# Patient Record
Sex: Male | Born: 1945 | Race: White | Hispanic: No | Smoking: Former smoker
Health system: Southern US, Community
[De-identification: ages and names within clinical notes are randomized; demographics above are authoritative.]

## PROBLEM LIST (undated history)

## (undated) DIAGNOSIS — I10 Essential (primary) hypertension: Secondary | ICD-10-CM

## (undated) DIAGNOSIS — F419 Anxiety disorder, unspecified: Secondary | ICD-10-CM

## (undated) HISTORY — PX: COLON SURGERY: SHX602

---

## 2014-07-05 ENCOUNTER — Ambulatory Visit: Payer: Self-pay | Admitting: Family Medicine

## 2014-10-02 ENCOUNTER — Ambulatory Visit: Payer: Medicare Other

## 2014-10-02 ENCOUNTER — Ambulatory Visit
Admission: EM | Admit: 2014-10-02 | Discharge: 2014-10-02 | Disposition: A | Discharge status: Home / Self Care | Payer: Medicare Other | Attending: Physician Assistant | Admitting: Physician Assistant

## 2014-10-02 DIAGNOSIS — S6991XA Unspecified injury of right wrist, hand and finger(s), initial encounter: Secondary | ICD-10-CM | POA: Diagnosis present

## 2014-10-02 DIAGNOSIS — Z7982 Long term (current) use of aspirin: Secondary | ICD-10-CM | POA: Diagnosis not present

## 2014-10-02 DIAGNOSIS — X58XXXA Exposure to other specified factors, initial encounter: Secondary | ICD-10-CM | POA: Insufficient documentation

## 2014-10-02 DIAGNOSIS — Z79899 Other long term (current) drug therapy: Secondary | ICD-10-CM | POA: Diagnosis not present

## 2014-10-02 DIAGNOSIS — S0081XA Abrasion of other part of head, initial encounter: Secondary | ICD-10-CM | POA: Diagnosis not present

## 2014-10-02 DIAGNOSIS — T148 Other injury of unspecified body region: Secondary | ICD-10-CM

## 2014-10-02 DIAGNOSIS — S80211A Abrasion, right knee, initial encounter: Secondary | ICD-10-CM | POA: Insufficient documentation

## 2014-10-02 DIAGNOSIS — T148XXA Other injury of unspecified body region, initial encounter: Secondary | ICD-10-CM

## 2014-10-02 HISTORY — DX: Anxiety disorder, unspecified: F41.9

## 2014-10-02 MED ORDER — TETANUS-DIPHTH-ACELL PERTUSSIS 5-2.5-18.5 LF-MCG/0.5 IM SUSP
0.5000 mL | Freq: Once | INTRAMUSCULAR | Status: AC
  Administered 2014-10-02: 0.5 mL via INTRAMUSCULAR

## 2014-10-02 MED ORDER — MUPIROCIN CALCIUM 2 % EX CREA: 1.0000 "application " | TOPICAL_CREAM | Freq: Three times a day (TID) | CUTANEOUS | Status: DC

## 2014-10-02 MED ORDER — AMOXICILLIN-POT CLAVULANATE 500-125 MG PO TABS: 1.0000 | ORAL_TABLET | Freq: Three times a day (TID) | ORAL | Status: DC

## 2014-10-02 MED ORDER — KETOROLAC TROMETHAMINE 60 MG/2ML IM SOLN
60.0000 mg | Freq: Once | INTRAMUSCULAR | Status: AC
  Administered 2014-10-02: 60 mg via INTRAMUSCULAR

## 2014-10-02 NOTE — ED Notes (Signed)
Right knee, cheek and chin abrasions cleaned with NS and anitibiotic onitment applied as directed.

## 2014-10-02 NOTE — ED Notes (Signed)
Pt fell while taking the garbage out due to the trash can being too heavy. Pt fell on cement about 1 hour ago. Pt's last Tdap was 09/09/2006.

## 2014-10-02 NOTE — Discharge Instructions (Signed)
Please use topical ointment to your facial wounds and right knee wound a few times a day- clen heands before and after- keep areas gently moist with the ointment to aid healing The finger dressings need to be clean and dry until Wound Care follow up Request evaluation tomorrow- Wednesday at the latest . Let us know if you don't hear from them  Keep fingers clean and dry--minimal use and keep elevated comfortably as much as possible

## 2014-10-03 ENCOUNTER — Encounter: Payer: Medicare Other | Attending: Surgery | Admitting: Surgery

## 2014-10-03 ENCOUNTER — Telehealth: Payer: Self-pay

## 2014-10-03 DIAGNOSIS — S61411A Laceration without foreign body of right hand, initial encounter: Secondary | ICD-10-CM | POA: Insufficient documentation

## 2014-10-03 DIAGNOSIS — Z87891 Personal history of nicotine dependence: Secondary | ICD-10-CM | POA: Diagnosis not present

## 2014-10-03 DIAGNOSIS — W19XXXA Unspecified fall, initial encounter: Secondary | ICD-10-CM | POA: Diagnosis not present

## 2014-10-03 DIAGNOSIS — R600 Localized edema: Secondary | ICD-10-CM | POA: Diagnosis not present

## 2014-10-03 NOTE — Progress Notes (Addendum)
KEISHAWN, RAJEWSKI ([id number]) Visit Report for 10/03/2014 Chief Complaint Document Details Patient Name: Manuel Cummings, Manuel Cummings Date of Service: 10/03/2014 1:00 PM Medical Record Number: [id number] Patient Account Number: [id number] Date of Birth/Sex: 03/14/1946 (69 y.o. Male) Treating RN: Primary Care Physician: Other Clinician: Referring Physician: Treating Physician/Extender: Frann Rider in Treatment: 0 Information Obtained from: Patient Chief Complaint Patient presents to the wound care center for a consult due non healing wound. Patient fell yesterday evening at 6 PM and injured his right hand on the concrete driveway and has had a pretty deep laceration on his right hand dorsum. Electronic Signature(s) Signed: 10/03/2014 3:34:49 PM By: Christin Fudge MD, FACS Entered By: Christin Fudge on 10/03/2014 14:25:22 ATHARVA, MIRSKY ([id number]) -------------------------------------------------------------------------------- HPI Details Patient Name: Manuel Cummings Date of Service: 10/03/2014 1:00 PM Medical Record Number: [id number] Patient Account Number: [id number] Date of Birth/Sex: 09/01/1945 (69 y.o. Male) Treating RN: Primary Care Physician: Other Clinician: Referring Physician: Treating Physician/Extender: Frann Rider in Treatment: 0 History of Present Illness Location: right hand and second third and fourth finger Quality: Patient reports experiencing a sharp pain to affected area(s). Severity: Wound is moderately severe with some exposed structure in the wound bed of the right hand dorsum on the middle finger Duration: Patient states the wound has been present for less than 24 hours. Timing: Pain in wound is constant (hurts all the time) Context: The wound occurred when the patient fell and injured his right hand dorsum on a concrete floor. Modifying Factors: Consults to this date include: in the ED at [facility] and has had a x-ray and some sutures placed. Associated  Signs and Symptoms: Patient reports presence of swelling HPI Description: This is an 45 gentleman warfarin on a concrete floor in his yard yesterday evening at about 6:00 and injured his right hand where the dorsum of the second third and fourth finger were injured and has had a complex lacerated wound. An x-ray was done which showed no bony abnormality but there was soft tissue injury and possible foreign bodies in this. The patient has had some sutures placed on the dorsum of his right middle finger but no details are available from the center. Also put on Augmentin. Tetanus toxoid order was given. Electronic Signature(s) Signed: 10/03/2014 3:34:49 PM By: Christin Fudge MD, FACS Entered By: Christin Fudge on 10/03/2014 14:30:43 Manuel Cummings ([id number]) -------------------------------------------------------------------------------- Physical Exam Details Patient Name: Manuel Cummings Date of Service: 10/03/2014 1:00 PM Medical Record Number: [id number] Patient Account Number: [id number] Date of Birth/Sex: 04-Aug-1945 (69 y.o. Male) Treating RN: Primary Care Physician: Other Clinician: Referring Physician: Treating Physician/Extender: Frann Rider in Treatment: 0 Constitutional . Pulse regular. Respirations normal and unlabored. Afebrile. . Eyes Nonicteric. Reactive to light. Ears, Nose, Mouth, and Throat Lips, teeth, and gums WNL.Marland Kitchen Moist mucosa without lesions . Neck supple and nontender. No palpable supraclavicular or cervical adenopathy. Normal sized without goiter. Respiratory WNL. No retractions.. Cardiovascular Pedal Pulses WNL. No clubbing, cyanosis or edema. Musculoskeletal Adexa without tenderness or enlargement.. Digits and nails w/o clubbing, cyanosis, infection, petechiae, ischemia, or inflammatory conditions.. Integumentary (Hair, Skin) He has superficial abrasions of the second and fourth finger on his right hand on the dorsum. The right middle finger dorsum  has a deeply lacerated wound just proximal to his nailbed and this has a skin flap which has been sutured in place. There are ulcerations on either side of the skin flap and I suspect tendon damage of the extensor as he is unable to  extend his finger well.. No crepitus or fluctuance. No peri-wound warmth or erythema. No masses.Marland Kitchen Psychiatric Judgement and insight Intact.. No evidence of depression, anxiety, or agitation.. Electronic Signature(s) Signed: 10/03/2014 3:34:49 PM By: Christin Fudge MD, FACS Entered By: Christin Fudge on 10/03/2014 14:39:03 Manuel Cummings ([id number]) -------------------------------------------------------------------------------- Physician Orders Details Patient Name: Manuel Cummings Date of Service: 10/03/2014 1:00 PM Medical Record Number: [id number] Patient Account Number: [id number] Date of Birth/Sex: 03-25-1946 (69 y.o. Male) Treating RN: Junious Dresser Primary Care Physician: Other Clinician: Referring Physician: Treating Physician/Extender: Frann Rider in Treatment: 0 Verbal / Phone Orders: Yes Clinician: Junious Dresser Read Back and Verified: Yes Diagnosis Coding Wound Cleansing Wound #1 Right Hand - 2nd Digit o Clean wound with Normal Saline. Wound #2 Right Hand - 3rd Digit o Clean wound with Normal Saline. Wound #3 Right Hand - 4th Digit o Clean wound with Normal Saline. Anesthetic Wound #1 Right Hand - 2nd Digit o Topical Lidocaine 4% cream applied to wound bed prior to debridement Wound #2 Right Hand - 3rd Digit o Topical Lidocaine 4% cream applied to wound bed prior to debridement Wound #3 Right Hand - 4th Digit o Topical Lidocaine 4% cream applied to wound bed prior to debridement Primary Wound Dressing Wound #1 Right Hand - 2nd Digit o Contact layer - Mepitel Wound #2 Right Hand - 3rd Digit o Contact layer - Mepitel Wound #3 Right Hand - 4th Digit o Contact layer - Mepitel Secondary Dressing Wound #1 Right  Hand - 2nd Digit o Gauze and Kerlix/Conform Wound #2 Right Hand - 3rd Digit o Gauze and Kerlix/Conform RAMERE, DOWNS ([id number]) Wound #3 Right Hand - 4th Digit o Gauze and Kerlix/Conform Dressing Change Frequency Wound #1 Right Hand - 2nd Digit o Other: - Pending consult with Dr. Margaretmary Eddy- please call for further instructions if Dr. Tamala Julian does not assume care of fingers Wound #2 Right Hand - 3rd Digit o Other: - Pending consult with Dr. Margaretmary Eddy- please call for further instructions if Dr. Tamala Julian does not assume care of fingers Wound #3 Right Hand - 4th Digit o Other: - Pending consult with Dr. Margaretmary Eddy- please call for further instructions if Dr. Tamala Julian does not assume care of fingers Follow-up Appointments Wound #1 Right Hand - 2nd Digit o Return Appointment in 1 week. - Pending consult with Dr. Margaretmary Eddy- please call to cancel appt with Dr. Tamala Julian assumes care of fingers Wound #2 Right Hand - 3rd Digit o Return Appointment in 1 week. - Pending consult with Dr. Margaretmary Eddy- please call to cancel appt with Dr. Tamala Julian assumes care of fingers Wound #3 Right Hand - 4th Digit o Return Appointment in 1 week. - Pending consult with Dr. Margaretmary Eddy- please call to cancel appt with Dr. Tamala Julian assumes care of fingers [facility] - Hand surgeon- Dr. Margaretmary Eddy appt 10/04/2014 11:00 am oooo Electronic Signature(s) Signed: 10/03/2014 3:34:49 PM By: Christin Fudge MD, FACS Signed: 10/03/2014 4:43:53 PM By: Junious Dresser RN Entered By: Junious Dresser on 10/03/2014 14:42:30 Catanese, Alfonso Patten ([id number]) -------------------------------------------------------------------------------- Problem List Details Patient Name: Manuel Cummings Date of Service: 10/03/2014 1:00 PM Medical Record Number: [id number] Patient Account Number: [id number] Date of Birth/Sex: May 25, 1945 (69 y.o. Male) Treating RN: Primary Care Physician: Other Clinician: Referring  Physician: Treating Physician/Extender: Frann Rider in Treatment: 0 Active Problems ICD-10 Encounter Code Description Active Date Diagnosis S61.411A Laceration without foreign body of right hand, initial 10/03/2014 Yes encounter Inactive Problems Resolved Problems  Electronic Signature(s) Signed: 10/03/2014 3:34:49 PM By: Christin Fudge MD, FACS Entered By: Christin Fudge on 10/03/2014 14:24:28 Manuel Cummings ([id number]) -------------------------------------------------------------------------------- Progress Note Details Patient Name: Manuel Cummings Date of Service: 10/03/2014 1:00 PM Medical Record Number: [id number] Patient Account Number: [id number] Date of Birth/Sex: [birth year] (69 y.o. Male) Treating RN: Primary Care Physician: Other Clinician: Referring Physician: Treating Physician/Extender: Frann Rider in Treatment: 0 Subjective Chief Complaint Information obtained from Patient Patient presents to the wound care center for a consult due non healing wound. Patient fell yesterday evening at 6 PM and injured his right hand on the concrete driveway and has had a pretty deep laceration on his right hand dorsum. History of Present Illness (HPI) The following HPI elements were documented for the patient's wound: Location: right hand and second third and fourth finger Quality: Patient reports experiencing a sharp pain to affected area(s). Severity: Wound is moderately severe with some exposed structure in the wound bed of the right hand dorsum on the middle finger Duration: Patient states the wound has been present for less than 24 hours. Timing: Pain in wound is constant (hurts all the time) Context: The wound occurred when the patient fell and injured his right hand dorsum on a concrete floor. Modifying Factors: Consults to this date include: in the ED at [facility] and has had a x-ray and some sutures placed. Associated Signs and Symptoms: Patient reports  presence of swelling This is an 63 gentleman warfarin on a concrete floor in his yard yesterday evening at about 6:00 and injured his right hand where the dorsum of the second third and fourth finger were injured and has had a complex lacerated wound. An x-ray was done which showed no bony abnormality but there was soft tissue injury and possible foreign bodies in this. The patient has had some sutures placed on the dorsum of his right middle finger but no details are available from the center. Also put on Augmentin. Tetanus toxoid order was given. Wound History Patient presents with 3 open wounds that have been present for approximately 24 hours. Patient has been treating wounds in the following manner: bandage from ED. Laboratory tests have not been performed in the last month. Patient reportedly has not tested positive for an antibiotic resistant organism. Patient reportedly has not tested positive for osteomyelitis. Patient reportedly has not had testing performed to evaluate circulation in the legs. Patient History Information obtained from Patient. SIE, FORMISANO ([id number]) Allergies penicillin, codeine Family History No family history of Cancer, Diabetes, Heart Disease, Hereditary Spherocytosis, Hypertension, Kidney Disease, Lung Disease, Seizures, Stroke, Thyroid Problems, Tuberculosis. Social History Former smoker, Marital Status - Married, Alcohol Use - Never, Drug Use - No History, Caffeine Use - Rarely. Medical History Eyes Denies history of Cataracts, Glaucoma, Optic Neuritis Ear/Nose/Mouth/Throat Denies history of Chronic sinus problems/congestion, Middle ear problems Hematologic/Lymphatic Denies history of Anemia, Hemophilia, Human Immunodeficiency Virus, Lymphedema, Sickle Cell Disease Respiratory Denies history of Aspiration, Asthma, Chronic Obstructive Pulmonary Disease (COPD), Pneumothorax, Sleep Apnea, Tuberculosis Cardiovascular Denies history of Angina,  Arrhythmia, Congestive Heart Failure, Coronary Artery Disease, Deep Vein Thrombosis, Hypertension, Hypotension, Myocardial Infarction, Peripheral Arterial Disease, Peripheral Venous Disease, Phlebitis, Vasculitis Gastrointestinal Denies history of Cirrhosis , Colitis, Crohn s, Hepatitis A, Hepatitis B, Hepatitis C Endocrine Denies history of Type I Diabetes, Type II Diabetes Genitourinary Denies history of End Stage Renal Disease Immunological Denies history of Lupus Erythematosus, Raynaud s, Scleroderma Integumentary (Skin) Denies history of History of Burn, History of pressure wounds Musculoskeletal Denies history  of Gout, Rheumatoid Arthritis, Osteoarthritis, Osteomyelitis Neurologic Denies history of Dementia, Neuropathy, Quadriplegia, Paraplegia, Seizure Disorder Oncologic Denies history of Received Chemotherapy, Received Radiation Psychiatric Denies history of Anorexia/bulimia, Confinement Anxiety Hospitalization/Surgery History - 10/02/2014, [facility] ED, post fall. Review of Systems (ROS) Constitutional Symptoms (General Health) The patient has no complaints or symptoms. Eyes DAEGON, DEISS ([id number]) The patient has no complaints or symptoms. Ear/Nose/Mouth/Throat The patient has no complaints or symptoms. Hematologic/Lymphatic The patient has no complaints or symptoms. Respiratory The patient has no complaints or symptoms. Cardiovascular The patient has no complaints or symptoms. Gastrointestinal The patient has no complaints or symptoms. Endocrine The patient has no complaints or symptoms. Genitourinary The patient has no complaints or symptoms. Immunological The patient has no complaints or symptoms. Integumentary (Skin) The patient has no complaints or symptoms. Musculoskeletal The patient has no complaints or symptoms. Neurologic The patient has no complaints or symptoms. Psychiatric Complains or has symptoms of Anxiety. Denies complaints or symptoms of  Claustrophobia. Medications aspirin 81 mg tablet,delayed release oral tablet,delayed release (DR/EC) oral lorazepam 0.5 mg tablet oral tablet oral amoxicillin 500 mg-potassium clavulanate 125 mg tablet oral tablet oral mupirocin 2 % topical cream topical cream topical amitriptyline 100 mg tablet oral tablet oral Objective Constitutional Pulse regular. Respirations normal and unlabored. Afebrile. Vitals Time Taken: 1:28 PM, Height: 74 in, Source: Stated, Weight: 195 lbs, Source: Stated, BMI: 25, Temperature: 97.9 F, Pulse: 67 bpm, Respiratory Rate: 18 breaths/min, Blood Pressure: 132/65 mmHg. MARTE, CELANI ([id number]) Eyes Nonicteric. Reactive to light. Ears, Nose, Mouth, and Throat Lips, teeth, and gums WNL.Marland Kitchen Moist mucosa without lesions . Neck supple and nontender. No palpable supraclavicular or cervical adenopathy. Normal sized without goiter. Respiratory WNL. No retractions.. Cardiovascular Pedal Pulses WNL. No clubbing, cyanosis or edema. Musculoskeletal Adexa without tenderness or enlargement.. Digits and nails w/o clubbing, cyanosis, infection, petechiae, ischemia, or inflammatory conditions.Marland Kitchen Psychiatric Judgement and insight Intact.. No evidence of depression, anxiety, or agitation.. Integumentary (Hair, Skin) He has superficial abrasions of the second and fourth finger on his right hand on the dorsum. The right middle finger dorsum has a deeply lacerated wound just proximal to his nailbed and this has a skin flap which has been sutured in place. There are ulcerations on either side of the skin flap and I suspect tendon damage of the extensor as he is unable to extend his finger well.. No crepitus or fluctuance. No peri-wound warmth or erythema. No masses.. Wound #1 status is Open. Original cause of wound was Trauma. The wound is located on the Right Hand - 2nd Digit. The wound measures 0.4cm length x 0.5cm width x 0.1cm depth; 0.157cm^2 area and 0.016cm^3 volume.  The wound is limited to skin breakdown. There is no tunneling or undermining noted. There is a large amount of sanguinous drainage noted. The wound margin is flat and intact. There is no granulation within the wound bed. There is no necrotic tissue within the wound bed. The periwound skin appearance exhibited: Localized Edema, Maceration. The periwound skin appearance did not exhibit: Callus, Crepitus, Excoriation, Fluctuance, Friable, Induration, Rash, Scarring, Dry/Scaly, Moist, Atrophie Blanche, Cyanosis, Ecchymosis, Hemosiderin Staining, Mottled, Pallor, Rubor, Erythema. Wound #2 status is Open. Original cause of wound was Trauma. The wound is located on the Right Hand - 3rd Digit. The wound measures 3.2cm length x 1.5cm width x 0.2cm depth; 3.77cm^2 area and 0.754cm^3 volume. The wound is limited to skin breakdown. There is no tunneling or undermining noted. There is a large amount of sanguinous drainage noted.  The wound margin is distinct with the outline attached to the wound base. There is no granulation within the wound bed. There is no necrotic tissue within the wound bed. The periwound skin appearance exhibited: Localized Edema, Maceration, Erythema. The periwound skin appearance did not exhibit: Callus, Crepitus, Excoriation, Fluctuance, Friable, Induration, Rash, Scarring, Dry/Scaly, Moist, Atrophie Blanche, Cyanosis, Ecchymosis, Hemosiderin Staining, Mottled, Pallor, Rubor. The surrounding wound skin color is noted with erythema. Periwound temperature was noted as No Abnormality. The periwound has tenderness on palpation. TREYSON, AXEL ([id number]) Wound #3 status is Open. Original cause of wound was Trauma. The wound is located on the Right Hand - 4th Digit. The wound measures 2.7cm length x 0.9cm width x 0.1cm depth; 1.909cm^2 area and 0.191cm^3 volume. The wound is limited to skin breakdown. There is no tunneling or undermining noted. There is a large amount of sanguinous  drainage noted. The wound margin is distinct with the outline attached to the wound base. There is no granulation within the wound bed. There is no necrotic tissue within the wound bed. The periwound skin appearance exhibited: Localized Edema, Maceration, Erythema. The periwound skin appearance did not exhibit: Callus, Crepitus, Excoriation, Fluctuance, Friable, Induration, Rash, Scarring, Dry/Scaly, Moist, Atrophie Blanche, Cyanosis, Ecchymosis, Hemosiderin Staining, Mottled, Pallor, Rubor. The surrounding wound skin color is noted with erythema. Periwound temperature was noted as No Abnormality. The periwound has tenderness on palpation. He has superficial abrasions of the second and fourth finger on his right hand on the dorsum. The right middle finger dorsum has a deeply lacerated wound just proximal to his nailbed and this has a skin flap which has been sutured in place. There are ulcerations on either side of the skin flap and I suspect tendon damage of the extensor as he is unable to extend his finger well. Assessment Active Problems ICD-10 S61.411A - Laceration without foreign body of right hand, initial encounter After a detailed examination I am concerned about a injury to the deep structures on the dorsum of the right middle finger. There also is a question of foreign bodies in this wound. There is no fracture on radiology. I have strongly recommended that he sees a hand surgeon as soon as possible and I have been able to speak to Dr. Margaretmary Eddy at the orthopedic center and he will see him tomorrow morning at 11 AM. I have applied a nonadherent dressing to these fingers and have recommended he sees Dr. Tamala Julian as planned. He will also continue with his oral anti-buttocks and pain medication and come back and see me next week The patient and his wife agreeable about the plan. Plan Wound Cleansing: AKEEN, LEDYARD ([id number]) Wound #1 Right Hand - 2nd Digit: Clean wound with  Normal Saline. Wound #2 Right Hand - 3rd Digit: Clean wound with Normal Saline. Wound #3 Right Hand - 4th Digit: Clean wound with Normal Saline. Anesthetic: Wound #1 Right Hand - 2nd Digit: Topical Lidocaine 4% cream applied to wound bed prior to debridement Wound #2 Right Hand - 3rd Digit: Topical Lidocaine 4% cream applied to wound bed prior to debridement Wound #3 Right Hand - 4th Digit: Topical Lidocaine 4% cream applied to wound bed prior to debridement Primary Wound Dressing: Wound #1 Right Hand - 2nd Digit: Contact layer - Mepitel Wound #2 Right Hand - 3rd Digit: Contact layer - Mepitel Wound #3 Right Hand - 4th Digit: Contact layer - Mepitel Secondary Dressing: Wound #1 Right Hand - 2nd Digit: Gauze and Kerlix/Conform Wound #2 Right  Hand - 3rd Digit: Gauze and Kerlix/Conform Wound #3 Right Hand - 4th Digit: Gauze and Kerlix/Conform Dressing Change Frequency: Wound #1 Right Hand - 2nd Digit: Other: - Pending consult with Dr. Margaretmary Eddy- please call for further instructions if Dr. Tamala Julian does not assume care of fingers Wound #2 Right Hand - 3rd Digit: Other: - Pending consult with Dr. Margaretmary Eddy- please call for further instructions if Dr. Tamala Julian does not assume care of fingers Wound #3 Right Hand - 4th Digit: Other: - Pending consult with Dr. Margaretmary Eddy- please call for further instructions if Dr. Tamala Julian does not assume care of fingers Follow-up Appointments: Wound #1 Right Hand - 2nd Digit: Return Appointment in 1 week. - Pending consult with Dr. Margaretmary Eddy- please call to cancel appt with Dr. Tamala Julian assumes care of fingers Wound #2 Right Hand - 3rd Digit: Return Appointment in 1 week. - Pending consult with Dr. Margaretmary Eddy- please call to cancel appt with Dr. Tamala Julian assumes care of fingers Wound #3 Right Hand - 4th Digit: Return Appointment in 1 week. - Pending consult with Dr. Margaretmary Eddy- please call to cancel appt with Dr. Tamala Julian assumes care of  fingers Consults ordered were: General Surgery - Hand surgeon- Dr. Margaretmary Eddy appt 10/04/2014 11:00 am LAZARO, ISENHOWER. ([id number]) After a detailed examination I am concerned about a injury to the deep structures on the dorsum of the right middle finger. There also is a question of foreign bodies in this wound. There is no fracture on radiology. I have strongly recommended that he sees a hand surgeon as soon as possible and I have been able to speak to Dr. Margaretmary Eddy at the orthopedic center and he will see him tomorrow morning at 11 AM. I have applied a nonadherent dressing to these fingers and have recommended he sees Dr. Tamala Julian as planned. He will also continue with his oral anti-buttocks and pain medication and come back and see me next week The patient and his wife agreeable about the plan. Electronic Signature(s) Signed: 10/10/2014 11:20:55 AM By: Junious Dresser RN Signed: 10/11/2014 4:45:54 PM By: Christin Fudge MD, FACS Previous Signature: 10/03/2014 3:34:49 PM Version By: Christin Fudge MD, FACS Entered By: Junious Dresser on 10/10/2014 11:20:54 Manuel Cummings ([id number]) -------------------------------------------------------------------------------- ROS/PFSH Details Patient Name: Manuel Cummings Date of Service: 10/03/2014 1:00 PM Medical Record Number: [id number] Patient Account Number: [id number] Date of Birth/Sex: [birth year] (69 y.o. Male) Treating RN: Montey Hora Primary Care Physician: Other Clinician: Referring Physician: Treating Physician/Extender: Frann Rider in Treatment: 0 Information Obtained From Patient Wound History Do you currently have one or more open woundso Yes How many open wounds do you currently haveo 3 Approximately how long have you had your woundso 24 hours How have you been treating your wound(s) until nowo bandage from ED Has your wound(s) ever healed and then re-openedo No Have you had any lab work done in the past montho No Have you  tested positive for an antibiotic resistant organism (MRSA, VRE)o No Have you tested positive for osteomyelitis (bone infection)o No Have you had any tests for circulation on your legso No Psychiatric Complaints and Symptoms: Positive for: Anxiety Negative for: Claustrophobia Medical History: Negative for: Anorexia/bulimia; Confinement Anxiety Constitutional Symptoms (General Health) Complaints and Symptoms: No Complaints or Symptoms Eyes Complaints and Symptoms: No Complaints or Symptoms Medical History: Negative for: Cataracts; Glaucoma; Optic Neuritis Ear/Nose/Mouth/Throat Complaints and Symptoms: No Complaints or Symptoms Medical History: SIRR, KABEL ([id number]) Negative for: Chronic sinus problems/congestion; Middle  ear problems Hematologic/Lymphatic Complaints and Symptoms: No Complaints or Symptoms Medical History: Negative for: Anemia; Hemophilia; Human Immunodeficiency Virus; Lymphedema; Sickle Cell Disease Respiratory Complaints and Symptoms: No Complaints or Symptoms Medical History: Negative for: Aspiration; Asthma; Chronic Obstructive Pulmonary Disease (COPD); Pneumothorax; Sleep Apnea; Tuberculosis Cardiovascular Complaints and Symptoms: No Complaints or Symptoms Medical History: Negative for: Angina; Arrhythmia; Congestive Heart Failure; Coronary Artery Disease; Deep Vein Thrombosis; Hypertension; Hypotension; Myocardial Infarction; Peripheral Arterial Disease; Peripheral Venous Disease; Phlebitis; Vasculitis Gastrointestinal Complaints and Symptoms: No Complaints or Symptoms Medical History: Negative for: Cirrhosis ; Colitis; Crohnos; Hepatitis A; Hepatitis B; Hepatitis C Endocrine Complaints and Symptoms: No Complaints or Symptoms Medical History: Negative for: Type I Diabetes; Type II Diabetes Genitourinary Complaints and Symptoms: No Complaints or Symptoms Medical HistoryJAYMIAN, BOGART ([id number]) Negative for: End Stage Renal  Disease Immunological Complaints and Symptoms: No Complaints or Symptoms Medical History: Negative for: Lupus Erythematosus; Raynaudos; Scleroderma Integumentary (Skin) Complaints and Symptoms: No Complaints or Symptoms Medical History: Negative for: History of Burn; History of pressure wounds Musculoskeletal Complaints and Symptoms: No Complaints or Symptoms Medical History: Negative for: Gout; Rheumatoid Arthritis; Osteoarthritis; Osteomyelitis Neurologic Complaints and Symptoms: No Complaints or Symptoms Medical History: Negative for: Dementia; Neuropathy; Quadriplegia; Paraplegia; Seizure Disorder Oncologic Medical History: Negative for: Received Chemotherapy; Received Radiation Hospitalization / Surgery History Name of Hospital Purpose of Hospitalization/Surgery Date [facility] ED post fall 10/02/2014 Family and Social History Cancer: No; Diabetes: No; Heart Disease: No; Hereditary Spherocytosis: No; Hypertension: No; Kidney Disease: No; Lung Disease: No; Seizures: No; Stroke: No; Thyroid Problems: No; Tuberculosis: No; Former smoker; Marital Status - Married; Alcohol Use: Never; Drug Use: No History; Caffeine Use: Rarely; Financial Concerns: No; Food, Clothing or Shelter Needs: No; Support System Lacking: No; Transportation Concerns: No; Advanced Directives: No; Patient does not want information on Advanced Directives; Living Will: No; Medical Power of Attorney: No Physician Affirmation JEMARCUS, DOUGAL ([id number]) I have reviewed and agree with the above information. Electronic Signature(s) Signed: 10/03/2014 2:45:57 PM By: Montey Hora Signed: 10/03/2014 3:34:49 PM By: Christin Fudge MD, FACS Entered By: Christin Fudge on 10/03/2014 13:49:58 FERDINAND, REVOIR ([id number]) -------------------------------------------------------------------------------- SuperBill Details Patient Name: Manuel Cummings Date of Service: 10/03/2014 Medical Record Number: [id number] Patient Account  Number: [id number] Date of Birth/Sex: [birth year] (69 y.o. Male) Treating RN: Primary Care Physician: Other Clinician: Referring Physician: Treating Physician/Extender: Frann Rider in Treatment: 0 Diagnosis Coding ICD-10 Codes Code Description [id number] Laceration without foreign body of right hand, initial encounter Facility Procedures CPT4 Code: 72536644 Description: [phone] - WOUND CARE VISIT-LEV 5 EST PT Modifier: Quantity: 1 Physician Procedures CPT4 Code: 2595638 Description: 75643 - WC PHYS LEVEL 4 - NEW PT ICD-10 Description Diagnosis S61.411A Laceration without foreign body of right hand, ini Modifier: tial encounter Quantity: 1 Electronic Signature(s) Signed: 10/03/2014 3:34:49 PM By: Christin Fudge MD, FACS Signed: 10/03/2014 4:43:53 PM By: Junious Dresser RN Entered By: Junious Dresser on 10/03/2014 15:31:19

## 2014-10-03 NOTE — ED Provider Notes (Signed)
CSN: [id number]     Arrival date & time 10/02/14  1823 History   None    Chief Complaint  Patient presents with  . Finger Injury  . Abrasion  . Fall   (Consider location/radiation/quality/duration/timing/severity/associated sxs/prior Treatment) HPI    69 yo M moving wheeled garbage can to curb when it capsized and he tangled in it. Right hand fingers 2-3-4 with distal avulsion injuries, nail disruption. Abrasion to right cheek, right chin and right knee. He and wife are present in exam room . He emphasizes that it was a tumble only there was no dizziness or other difficulty prior to fall. The garbage can was awkward and the wheel hit the curb. Denies any LOC, dizziness or disorientation- only frustration -after. Last Tetanus unknown  Past Medical History  Diagnosis Date  . Anxiety    History reviewed. No pertinent past surgical history. History reviewed. No pertinent family history. History  Substance Use Topics  . Smoking status: Former Smoker -- 1.00 packs/day for 2 years    Quit date: 04/28/1962  . Smokeless tobacco: Never Used  . Alcohol Use: No    Review of Systems  Constitutional -afebrile Eyes-denies visual changes ENT- normal voice, CV-denies chest pain Resp-denies SOB GI- negative for nausea,vomiting, abdominal pain Back- no back pain GU- negative for dysuria MSK- negative for back pain, ambulatory Skin-  acute abrasion injury right cheek, chin, knee,  Avulsion injuries right hand fingers 2-3-4 Neuro- negative headache,focal weakness or numbness    Allergies  Review of patient's allergies indicates no known allergies.  Home Medications   Prior to Admission medications   Medication Sig Start Date End Date Taking? Authorizing Provider  amitriptyline (ELAVIL) 100 MG tablet Take 300 mg by mouth at bedtime.   Yes Historical Provider, MD  aspirin 81 MG tablet Take 81 mg by mouth daily.   Yes Historical Provider, MD  LORazepam (ATIVAN) 0.5 MG tablet Take 0.5 mg by  mouth every 8 (eight) hours.   Yes Historical Provider, MD  amoxicillin-clavulanate (AUGMENTIN) 500-125 MG per tablet Take 1 tablet (500 mg total) by mouth 3 (three) times daily. 10/02/14   Jan Fireman, PA-C  mupirocin cream (BACTROBAN) 2 % Apply 1 application topically 3 (three) times daily. 10/02/14   Jan Fireman, PA-C   BP 149/72 mmHg  Pulse 78  Temp(Src) 98.1 F (36.7 C) (Oral)  Resp 16  Ht 6\' 2"  (1.88 m)  Wt 195 lb (88.451 kg)  BMI 25.03 kg/m2  SpO2 100% Physical Exam  Constitutional -alert and oriented,well appearing except trauma Head-right cheek and right chin with superficial abrasions from cement curb brush Eyes- conjunctiva normal, EOMI ,conjugate gaze,glasses intact Ears- canals and TM bilat negative Nose- no congestion or rhinorrhea, no bleeding Mouth/throat- mucous membranes moist ,no dental injury noted, good ROM jaw without discomfort Neck- supple CV- regular rate, grossly normal heart sounds, good peripheral circulation Resp-no distress, normal respiratory effort,clear to auscultation bilaterally GI- no distention GU- not examined MSK-  Ambulatory, right knee superficial abrasion Neuro- normal speech and language, no gross focal neurological deficit appreciated, no gait instability Skin-trauma right 2nd with distal nail avulsion and nailbed injury; right 3 with dorsal tissue loss MIP, flexion of DIP incomplete, flap present and viable appearing. Nail had been lifted on right but returned to position ;Right 4th with dorsal abrasion and flap, nail fracture. Psych-mood and affect grossly normal; speech and behavior grossly normal   ED Course  Procedures (including critical care time) Labs Review Labs Reviewed -  No data to display  Imaging Review Dg Hand Complete Right  10/02/2014   CLINICAL DATA:  Recent fall while pushing trash can with finger lacerations  EXAM: RIGHT HAND - COMPLETE 3+ VIEW  COMPARISON:  None.  FINDINGS: Obvious soft tissue deformity is noted  particularly in the distal aspect of the third digit. No underlying fracture is seen. Tiny densities are noted adjacent to the soft tissue injury the distal third digit. Would be difficult to exclude small radiopaque foreign bodies.  IMPRESSION: No acute bony abnormality is noted.  Soft tissue injury as described.   Electronically Signed   By: Inez Catalina M.D.   On: 10/02/2014 20:10   Medications  ketorolac (TORADOL) injection 60 mg (60 mg Intramuscular Given 10/02/14 2102)  Tdap (BOOSTRIX) injection 0.5 mL (0.5 mLs Intramuscular Given 10/02/14 2102)  Well tolerated with improvement in discomfort. Tdap update done pt tolerated well  Right hand copiously washed in saline and cholorhexidine.. Range of motion of 2 and 4 WNL, third has reduced flexion of DIP. Sent to xray.No fracture noted. Cannot r/i or r/o debris in tissue  Local block with 1% lidocaine plain. Initially addressed right index finger-distal tip with fractured nail still adherent to nailbed-excised nail and skin edges debrided.  Right hand 4th finger- nail disrupted from base, peripheral tissue abraded and superficially avulsed with ragged edges. Copious irrigation with saline and syringe, tiny fragments sand flushed. Tissue edges debrided. Hemostasis achieved Attention to third/middle finger. Moderately extensive tissue loss dorsal surface MIP , single flap attached right with broad base-grossly appears viable at present. Clots flushed from beneath with saline- hemostasis achieved with gentle pressure. Medial margin of nail lifted from bed and copious saline flush, the replaced in situ. Fla position  re-approximated and gently tacked in place with 4-0 Ethilon simple sutures. Discussed findings with patient and wife as I worked. Placed in bulky dressing of individual fingers wraps. Later noted to have some bleeding and fingers re-wrapped and bulky glove dressing applied.   His facial abrasions were superficial as was his right knee abrasion.  They are cleansed with saline and chlorhexidine and dressed with neosporin as available. Have requested they see Wound Care team ASAP for evaluation and careplan. Wife is given phone numbers and nursing staff will attempt to call in AM when open as well. Antibiotics to be started po, and mupriocin can be applied to abrasions, later to finger wounds if Wound Care agrees.  MDM   1. Avulsion injury   2. Abrasion of right knee, initial encounter   3. Abrasion of cheek, initial encounter   4. Abrasion of chin, initial encounter    Discharge Medication List as of 10/02/2014  9:10 PM    START taking these medications   Details  amoxicillin-clavulanate (AUGMENTIN) 500-125 MG per tablet Take 1 tablet (500 mg total) by mouth 3 (three) times daily., Starting 10/02/2014, Until Discontinued, Print    mupirocin cream (BACTROBAN) 2 % Apply 1 application topically 3 (three) times daily., Starting 10/02/2014, Until Discontinued, Print       Plan: 1. x-ray results and diagnosis reviewed with patient 2. rx as per orders; risks, benefits, potential side effects reviewed with patient 3. Recommend supportive treatment with bulky dressings  4.Plan consultation with Wound Care Team in AM 5. Questions fielded, expectations and recommendations reviewed.  Patient and wife express understanding.  Will return to [facility] with questions, concern or exacerbation.    Jan Fireman, PA-C 10/03/14 2022

## 2014-10-03 NOTE — Progress Notes (Signed)
Manuel Cummings, Manuel Cummings ([id number]) Visit Report for 10/03/2014 Allergy List Details Patient Name: Manuel Cummings, Manuel Cummings Date of Service: 10/03/2014 1:00 PM Medical Record Number: [id number] Patient Account Number: [id number] Date of Birth/Sex: [birth year] (69 y.o. Male) Treating RN: Montey Hora Primary Care Physician: Other Clinician: Referring Physician: Treating Physician/Extender: Frann Rider in Treatment: 0 Allergies Active Allergies penicillin codeine Allergy Notes Electronic Signature(s) Signed: 10/03/2014 2:45:57 PM By: Montey Hora Entered By: Montey Hora on 10/03/2014 13:30:42 Manuel Cummings ([id number]) -------------------------------------------------------------------------------- Arrival Information Details Patient Name: Manuel Cummings Date of Service: 10/03/2014 1:00 PM Medical Record Number: [id number] Patient Account Number: [id number] Date of Birth/Sex: March 06, 1946 (69 y.o. Male) Treating RN: Montey Hora Primary Care Physician: Other Clinician: Referring Physician: Treating Physician/Extender: Frann Rider in Treatment: 0 Visit Information Patient Arrived: Ambulatory Arrival Time: 13:18 Accompanied By: spouse Transfer Assistance: None Patient Identification Verified: Yes Secondary Verification Process Yes Completed: Electronic Signature(s) Signed: 10/03/2014 2:45:57 PM By: Montey Hora Entered By: Montey Hora on 10/03/2014 13:27:31 Manuel Cummings ([id number]) -------------------------------------------------------------------------------- Clinic Level of Care Assessment Details Patient Name: Manuel Cummings Date of Service: 10/03/2014 1:00 PM Medical Record Number: [id number] Patient Account Number: [id number] Date of Birth/Sex: 04-30-1945 (69 y.o. Male) Treating RN: Junious Dresser Primary Care Physician: Other Clinician: Referring Physician: Treating Physician/Extender: Frann Rider in Treatment: 0 Clinic Level of Care  Assessment Items TOOL 2 Quantity Score []  - Use when only an EandM is performed on the INITIAL visit 0 ASSESSMENTS - Nursing Assessment / Reassessment []  - General Physical Exam (combine w/ comprehensive assessment (listed just 0 below) when performed on new pt. evals) X - Comprehensive Assessment (HX, ROS, Risk Assessments, Wounds Hx, etc.) 1 25 ASSESSMENTS - Wound and Skin Assessment / Reassessment []  - Simple Wound Assessment / Reassessment - one wound 0 X - Complex Wound Assessment / Reassessment - multiple wounds 3 5 []  - Dermatologic / Skin Assessment (not related to wound area) 0 ASSESSMENTS - Ostomy and/or Continence Assessment and Care []  - Incontinence Assessment and Management 0 []  - Ostomy Care Assessment and Management (repouching, etc.) 0 PROCESS - Coordination of Care []  - Simple Patient / Family Education for ongoing care 0 X - Complex (extensive) Patient / Family Education for ongoing care 1 20 X - Staff obtains Programmer, systems, Records, Test Results / Process Orders 1 10 []  - Staff telephones HHA, Nursing Homes / Clarify orders / etc 0 []  - Routine Transfer to another Facility (non-emergent condition) 0 []  - Routine Hospital Admission (non-emergent condition) 0 X - New Admissions / Biomedical engineer / Ordering NPWT, Apligraf, etc. 1 15 []  - Emergency Hospital Admission (emergent condition) 0 []  - Simple Discharge Coordination 0 Bristow, Nihar G. ([id number]) X - Complex (extensive) Discharge Coordination 1 15 PROCESS - Special Needs []  - Pediatric / Minor Patient Management 0 []  - Isolation Patient Management 0 []  - Hearing / Language / Visual special needs 0 []  - Assessment of Community assistance (transportation, D/C planning, etc.) 0 []  - Additional assistance / Altered mentation 0 []  - Support Surface(s) Assessment (bed, cushion, seat, etc.) 0 INTERVENTIONS - Wound Cleansing / Measurement X - Wound Imaging (photographs - any number of wounds) 1 5 []  - Wound  Tracing (instead of photographs) 0 []  - Simple Wound Measurement - one wound 0 X - Complex Wound Measurement - multiple wounds 3 5 []  - Simple Wound Cleansing - one wound 0 X - Complex Wound Cleansing - multiple wounds 3 5 INTERVENTIONS - Wound Dressings X - Small  Wound Dressing one or multiple wounds 3 10 []  - Medium Wound Dressing one or multiple wounds 0 []  - Large Wound Dressing one or multiple wounds 0 []  - Application of Medications - injection 0 INTERVENTIONS - Miscellaneous []  - External ear exam 0 []  - Specimen Collection (cultures, biopsies, blood, body fluids, etc.) 0 []  - Specimen(s) / Culture(s) sent or taken to Lab for analysis 0 []  - Patient Transfer (multiple staff / Harrel Lemon Lift / Similar devices) 0 []  - Simple Staple / Suture removal (25 or less) 0 []  - Complex Staple / Suture removal (26 or more) 0 Aldea, Ahmadou G. ([id number]) []  - Hypo / Hyperglycemic Management (close monitor of Blood Glucose) 0 []  - Ankle / Brachial Index (ABI) - do not check if billed separately 0 Has the patient been seen at the hospital within the last three years: Yes Total Score: 165 Level Of Care: New/Established - Level 5 Electronic Signature(s) Signed: 10/03/2014 4:43:53 PM By: Junious Dresser RN Entered By: Junious Dresser on 10/03/2014 15:31:08 Manuel Cummings ([id number]) -------------------------------------------------------------------------------- Encounter Discharge Information Details Patient Name: Manuel Cummings Date of Service: 10/03/2014 1:00 PM Medical Record Number: [id number] Patient Account Number: [id number] Date of Birth/Sex: 1946-03-06 (69 y.o. Male) Treating RN: Primary Care Physician: Other Clinician: Referring Physician: Treating Physician/Extender: Frann Rider in Treatment: 0 Encounter Discharge Information Items Discharge Pain Level: 0 Discharge Condition: Stable Ambulatory Status: Ambulatory Discharge Destination: Home Transportation: Private  Auto Accompanied By: spouse Schedule Follow-up Appointment: Yes Medication Reconciliation completed and provided to Patient/Care No Rosina Cressler: Provided on Clinical Summary of Care: 10/03/2014 Form Type Recipient Paper Patient [facility] Electronic Signature(s) Signed: 10/03/2014 2:45:08 PM By: Montey Hora Previous Signature: 10/03/2014 2:43:21 PM Version By: Ruthine Dose Entered By: Montey Hora on 10/03/2014 14:45:08 Manuel Cummings ([id number]) -------------------------------------------------------------------------------- Pain Assessment Details Patient Name: Manuel Cummings Date of Service: 10/03/2014 1:00 PM Medical Record Number: [id number] Patient Account Number: [id number] Date of Birth/Sex: [birth year] (69 y.o. Male) Treating RN: Montey Hora Primary Care Physician: Other Clinician: Referring Physician: Treating Physician/Extender: Frann Rider in Treatment: 0 Active Problems Location of Pain Severity and Description of Pain Patient Has Paino Yes Site Locations Pain Location: Pain in Ulcers With Dressing Change: Yes Duration of the Pain. Constant / Intermittento Intermittent Pain Management and Medication Current Pain Management: Electronic Signature(s) Signed: 10/03/2014 2:45:57 PM By: Montey Hora Entered By: Montey Hora on 10/03/2014 13:28:02 Manuel Cummings ([id number]) -------------------------------------------------------------------------------- Patient/Caregiver Education Details Patient Name: Manuel Cummings Date of Service: 10/03/2014 1:00 PM Medical Record Number: [id number] Patient Account Number: [id number] Date of Birth/Gender: 1945/11/19 (69 y.o. Male) Treating RN: Montey Hora Primary Care Physician: Other Clinician: Referring Physician: Treating Physician/Extender: Frann Rider in Treatment: 0 Education Assessment Education Provided To: Patient and Caregiver Education Topics Provided Wound/Skin  Impairment: Handouts: Other: wound care as ordered and the purpose of seeing a hand surgeon Methods: Demonstration, Explain/Verbal Responses: State content correctly Electronic Signature(s) Signed: 10/03/2014 2:45:46 PM By: Montey Hora Entered By: Montey Hora on 10/03/2014 14:45:46 Dilmore, Alfonso Patten ([id number]) -------------------------------------------------------------------------------- Wound Assessment Details Patient Name: Manuel Cummings Date of Service: 10/03/2014 1:00 PM Medical Record Number: [id number] Patient Account Number: [id number] Date of Birth/Sex: [birth year] (69 y.o. Male) Treating RN: Montey Hora Primary Care Physician: Other Clinician: Referring Physician: Treating Physician/Extender: Frann Rider in Treatment: 0 Wound Status Wound Number: 1 Primary Etiology: Trauma, Other Wound Location: Right Hand - 2nd Digit Wound Status: Open Wounding Event: Trauma Date Acquired: 10/02/2014 Weeks Of Treatment: 0 Clustered Wound:  No Photos Photo Uploaded By: Montey Hora on 10/03/2014 14:42:45 Wound Measurements Length: (cm) 0.4 Width: (cm) 0.5 Depth: (cm) 0.1 Area: (cm) 0.157 Volume: (cm) 0.016 % Reduction in Area: 0% % Reduction in Volume: 0% Epithelialization: None Tunneling: No Undermining: No Wound Description Full Thickness Without Exposed Classification: Support Structures Wound Margin: Flat and Intact Exudate Large Amount: Exudate Type: Sanguinous Exudate Color: red Foul Odor After Cleansing: No Wound Bed Granulation Amount: None Present (0%) Exposed Structure Necrotic Amount: None Present (0%) Fascia Exposed: No Fat Layer Exposed: No Rozas, Nils G. ([id number]) Tendon Exposed: No Muscle Exposed: No Joint Exposed: No Bone Exposed: No Limited to Skin Breakdown Periwound Skin Texture Texture Color No Abnormalities Noted: No No Abnormalities Noted: No Callus: No Atrophie Blanche: No Crepitus: No Cyanosis:  No Excoriation: No Ecchymosis: No Fluctuance: No Erythema: No Friable: No Hemosiderin Staining: No Induration: No Mottled: No Localized Edema: Yes Pallor: No Rash: No Rubor: No Scarring: No Moisture No Abnormalities Noted: No Dry / Scaly: No Maceration: Yes Moist: No Wound Preparation Ulcer Cleansing: Rinsed/Irrigated with Saline Topical Anesthetic Applied: Other: lidocaine 4%, Treatment Notes Wound #1 (Right Hand - 2nd Digit) 1. Cleansed with: Clean wound with Normal Saline 2. Anesthetic Topical Lidocaine 4% cream to wound bed prior to debridement 4. Dressing Applied: Mepitel 5. Secondary Dressing Applied Gauze and Kerlix/Conform 7. Secured with Recruitment consultant) Signed: 10/03/2014 2:45:57 PM By: Montey Hora Entered By: Montey Hora on 10/03/2014 14:02:01 Manuel Cummings ([id number]) -------------------------------------------------------------------------------- Wound Assessment Details Patient Name: Manuel Cummings Date of Service: 10/03/2014 1:00 PM Medical Record Number: [id number] Patient Account Number: [id number] Date of Birth/Sex: 20-Aug-1945 (69 y.o. Male) Treating RN: Montey Hora Primary Care Physician: Other Clinician: Referring Physician: Treating Physician/Extender: Frann Rider in Treatment: 0 Wound Status Wound Number: 2 Primary Etiology: Trauma, Other Wound Location: Right Hand - 3rd Digit Wound Status: Open Wounding Event: Trauma Date Acquired: 10/02/2014 Weeks Of Treatment: 0 Clustered Wound: No Photos Photo Uploaded By: Montey Hora on 10/03/2014 14:42:45 Wound Measurements Length: (cm) 3.2 Width: (cm) 1.5 Depth: (cm) 0.2 Area: (cm) 3.77 Volume: (cm) 0.754 % Reduction in Area: 0% % Reduction in Volume: 0% Epithelialization: None Tunneling: No Undermining: No Wound Description Full Thickness Without Exposed Classification: Support Structures Wound Margin: Distinct, outline  attached Exudate Large Amount: Exudate Type: Sanguinous Exudate Color: red Foul Odor After Cleansing: No Wound Bed Granulation Amount: None Present (0%) Exposed Structure Necrotic Amount: None Present (0%) Fascia Exposed: No Fat Layer Exposed: No Vandenberghe, Bradly G. ([id number]) Tendon Exposed: No Muscle Exposed: No Joint Exposed: No Bone Exposed: No Limited to Skin Breakdown Periwound Skin Texture Texture Color No Abnormalities Noted: No No Abnormalities Noted: No Callus: No Atrophie Blanche: No Crepitus: No Cyanosis: No Excoriation: No Ecchymosis: No Fluctuance: No Erythema: Yes Friable: No Hemosiderin Staining: No Induration: No Mottled: No Localized Edema: Yes Pallor: No Rash: No Rubor: No Scarring: No Temperature / Pain Moisture Temperature: No Abnormality No Abnormalities Noted: No Tenderness on Palpation: Yes Dry / Scaly: No Maceration: Yes Moist: No Wound Preparation Ulcer Cleansing: Rinsed/Irrigated with Saline Topical Anesthetic Applied: Other: lidocaine 4%, Treatment Notes Wound #2 (Right Hand - 3rd Digit) 1. Cleansed with: Clean wound with Normal Saline 2. Anesthetic Topical Lidocaine 4% cream to wound bed prior to debridement 4. Dressing Applied: Mepitel 5. Secondary Dressing Applied Gauze and Kerlix/Conform 7. Secured with Recruitment consultant) Signed: 10/03/2014 2:04:48 PM By: Montey Hora Entered By: Montey Hora on 10/03/2014 14:04:48 Rath, Alfonso Patten ([id number]) -------------------------------------------------------------------------------- Wound Assessment Details  Patient Name: Manuel Cummings, Manuel Cummings Date of Service: 10/03/2014 1:00 PM Medical Record Number: [id number] Patient Account Number: [id number] Date of Birth/Sex: 1945/07/04 (69 y.o. Male) Treating RN: Montey Hora Primary Care Physician: Other Clinician: Referring Physician: Treating Physician/Extender: Frann Rider in Treatment: 0 Wound Status Wound  Number: 3 Primary Etiology: Trauma, Other Wound Location: Right Hand - 4th Digit Wound Status: Open Wounding Event: Trauma Date Acquired: 10/02/2014 Weeks Of Treatment: 0 Clustered Wound: No Photos Photo Uploaded By: Montey Hora on 10/03/2014 14:43:01 Wound Measurements Length: (cm) 2.7 Width: (cm) 0.9 Depth: (cm) 0.1 Area: (cm) 1.909 Volume: (cm) 0.191 % Reduction in Area: 0% % Reduction in Volume: 0% Epithelialization: None Tunneling: No Undermining: No Wound Description Full Thickness Without Exposed Classification: Support Structures Wound Margin: Distinct, outline attached Exudate Large Amount: Exudate Type: Sanguinous Exudate Color: red Foul Odor After Cleansing: No Wound Bed Granulation Amount: None Present (0%) Exposed Structure Necrotic Amount: None Present (0%) Fascia Exposed: No Fat Layer Exposed: No Gruszka, Harshan G. ([id number]) Tendon Exposed: No Muscle Exposed: No Joint Exposed: No Bone Exposed: No Limited to Skin Breakdown Periwound Skin Texture Texture Color No Abnormalities Noted: No No Abnormalities Noted: No Callus: No Atrophie Blanche: No Crepitus: No Cyanosis: No Excoriation: No Ecchymosis: No Fluctuance: No Erythema: Yes Friable: No Hemosiderin Staining: No Induration: No Mottled: No Localized Edema: Yes Pallor: No Rash: No Rubor: No Scarring: No Temperature / Pain Moisture Temperature: No Abnormality No Abnormalities Noted: No Tenderness on Palpation: Yes Dry / Scaly: No Maceration: Yes Moist: No Wound Preparation Ulcer Cleansing: Rinsed/Irrigated with Saline Topical Anesthetic Applied: Other: lidocaine 4%, Treatment Notes Wound #3 (Right Hand - 4th Digit) 1. Cleansed with: Clean wound with Normal Saline 2. Anesthetic Topical Lidocaine 4% cream to wound bed prior to debridement 4. Dressing Applied: Mepitel 5. Secondary Dressing Applied Gauze and Kerlix/Conform 7. Secured with Architect) Signed: 10/03/2014 2:05:26 PM By: Montey Hora Entered By: Montey Hora on 10/03/2014 14:05:26 Manuel Cummings ([id number]) -------------------------------------------------------------------------------- [place] Details Patient Name: Manuel Cummings Date of Service: 10/03/2014 1:00 PM Medical Record Number: [id number] Patient Account Number: [id number] Date of Birth/Sex: Nov 27, 1945 (69 y.o. Male) Treating RN: Montey Hora Primary Care Physician: Other Clinician: Referring Physician: Treating Physician/Extender: Frann Rider in Treatment: 0 Vital Signs Time Taken: 13:28 Temperature (F): 97.9 Height (in): 74 Pulse (bpm): 67 Source: Stated Respiratory Rate (breaths/min): 18 Weight (lbs): 195 Blood Pressure (mmHg): 132/65 Source: Stated Reference Range: 80 - 120 mg / dl Body Mass Index (BMI): 25 Electronic Signature(s) Signed: 10/03/2014 2:45:57 PM By: Montey Hora Entered By: Montey Hora on 10/03/2014 13:30:19

## 2014-10-03 NOTE — Progress Notes (Signed)
Manuel Cummings, Manuel Cummings ([id number]) Visit Report for 10/03/2014 Abuse/Suicide Risk Screen Details Patient Name: Manuel Cummings, Manuel Cummings Date of Service: 10/03/2014 1:00 PM Medical Record Number: [id number] Patient Account Number: [id number] Date of Birth/Sex: 06-Feb-1946 (69 y.o. Male) Treating RN: Montey Hora Primary Care Physician: Other Clinician: Referring Physician: Treating Physician/Extender: Frann Rider in Treatment: 0 Abuse/Suicide Risk Screen Items Answer ABUSE/SUICIDE RISK SCREEN: Has anyone close to you tried to hurt or harm you recentlyo No Do you feel uncomfortable with anyone in your familyo No Has anyone forced you do things that you didnot want to doo No Do you have any thoughts of harming yourselfo No Patient displays signs or symptoms of abuse and/or neglect. No Electronic Signature(s) Signed: 10/03/2014 2:45:57 PM By: Montey Hora Entered By: Montey Hora on 10/03/2014 13:34:07 Defelice, Alfonso Patten ([id number]) -------------------------------------------------------------------------------- Activities of Daily Living Details Patient Name: Manuel Cummings Date of Service: 10/03/2014 1:00 PM Medical Record Number: [id number] Patient Account Number: [id number] Date of Birth/Sex: 1945/06/08 (69 y.o. Male) Treating RN: Montey Hora Primary Care Physician: Other Clinician: Referring Physician: Treating Physician/Extender: Frann Rider in Treatment: 0 Activities of Daily Living Items Answer Activities of Daily Living (Please select one for each item) Drive Automobile Not Able Take Medications Completely Able Use Telephone Completely Able Care for Appearance Completely Able Use Toilet Completely Able Bath / Shower Completely Able Dress Self Completely Able Feed Self Completely Able Walk Completely Able Get In / Out Bed Completely Able Housework Completely Able Prepare Meals Completely [place] for Self Completely  Able Electronic Signature(s) Signed: 10/03/2014 2:45:57 PM By: Montey Hora Entered By: Montey Hora on 10/03/2014 13:34:29 Manuel Cummings ([id number]) -------------------------------------------------------------------------------- Education Assessment Details Patient Name: Manuel Cummings Date of Service: 10/03/2014 1:00 PM Medical Record Number: [id number] Patient Account Number: [id number] Date of Birth/Sex: 07-Dec-1945 (69 y.o. Male) Treating RN: Montey Hora Primary Care Physician: Other Clinician: Referring Physician: Treating Physician/Extender: Frann Rider in Treatment: 0 Primary Learner Assessed: Patient Learning Preferences/Education Level/Primary Language Learning Preference: Explanation, Demonstration Highest Education Level: College or Above Preferred Language: English Cognitive Barrier Assessment/Beliefs Language Barrier: No Translator Needed: No Memory Deficit: No Emotional Barrier: No Cultural/Religious Beliefs Affecting Medical No Care: Physical Barrier Assessment Impaired Vision: No Impaired Hearing: No Decreased Hand dexterity: No Knowledge/Comprehension Assessment Knowledge Level: Medium Comprehension Level: Medium Ability to understand written Medium instructions: Ability to understand verbal Medium instructions: Motivation Assessment Anxiety Level: Calm Cooperation: Cooperative Education Importance: Acknowledges Need Interest in Health Problems: Asks Questions Perception: Coherent Willingness to Engage in Self- Medium Management Activities: Readiness to Engage in Self- Medium Management Activities: Electronic Signature(s) Manuel Cummings, Manuel Cummings ([id number]) Signed: 10/03/2014 2:45:57 PM By: Montey Hora Entered By: Montey Hora on 10/03/2014 13:34:50 Manuel Cummings ([id number]) -------------------------------------------------------------------------------- Fall Risk Assessment Details Patient Name: Manuel Cummings Date  of Service: 10/03/2014 1:00 PM Medical Record Number: [id number] Patient Account Number: [id number] Date of Birth/Sex: 05-01-1945 (69 y.o. Male) Treating RN: Montey Hora Primary Care Physician: Other Clinician: Referring Physician: Treating Physician/Extender: Frann Rider in Treatment: 0 Fall Risk Assessment Items FALL RISK ASSESSMENT: History of falling - immediate or within 3 months 25 Yes Secondary diagnosis 0 No Ambulatory aid None/bed rest/wheelchair/nurse 0 Yes Crutches/cane/walker 0 No Furniture 0 No IV Access/Saline Lock 0 No Gait/Training Normal/bed rest/immobile 0 Yes Weak 0 No Impaired 0 No Mental Status Oriented to own ability 0 Yes Electronic Signature(s) Signed: 10/03/2014 2:45:57 PM By: Montey Hora Entered By: Montey Hora on 10/03/2014 13:34:58 Graser, Alfonso Patten ([id number]) --------------------------------------------------------------------------------  Nutrition Risk Assessment Details Patient Name: Manuel Cummings, Manuel Cummings Date of Service: 10/03/2014 1:00 PM Medical Record Number: [id number] Patient Account Number: [id number] Date of Birth/Sex: [birth year] (69 y.o. Male) Treating RN: Montey Hora Primary Care Physician: Other Clinician: Referring Physician: Treating Physician/Extender: Frann Rider in Treatment: 0 Height (in): 74 Weight (lbs): 195 Body Mass Index (BMI): 25 Nutrition Risk Assessment Items NUTRITION RISK SCREEN: I have an illness or condition that made me change the kind and/or 0 No amount of food I eat I eat fewer than two meals per day 0 No I eat few fruits and vegetables, or milk products 0 No I have three or more drinks of beer, liquor or wine almost every day 0 No I have tooth or mouth problems that make it hard for me to eat 0 No I don't always have enough money to buy the food I need 0 No I eat alone most of the time 0 No I take three or more different prescribed or over-the-counter drugs a 0 No day Without wanting  to, I have lost or gained 10 pounds in the last six 0 No months I am not always physically able to shop, cook and/or feed myself 0 No Nutrition Protocols Good Risk Protocol 0 No interventions needed Moderate Risk Protocol Electronic Signature(s) Signed: 10/03/2014 2:45:57 PM By: Montey Hora Entered By: Montey Hora on 10/03/2014 13:35:11

## 2014-10-03 NOTE — ED Notes (Signed)
Call to [facility] to check status of referral made last evening. Spoke with Gay Filler and informed pt is currently there being evaluated. Thanked for prompt care for our patient.

## 2014-10-10 ENCOUNTER — Ambulatory Visit: Payer: Medicare Other | Admitting: Surgery

## 2017-02-28 ENCOUNTER — Ambulatory Visit
Admission: EM | Admit: 2017-02-28 | Discharge: 2017-02-28 | Disposition: A | Discharge status: Home / Self Care | Payer: Medicare Other | Attending: Emergency Medicine | Admitting: Emergency Medicine

## 2017-02-28 ENCOUNTER — Encounter: Payer: Self-pay | Admitting: Gynecology

## 2017-02-28 DIAGNOSIS — I872 Venous insufficiency (chronic) (peripheral): Secondary | ICD-10-CM

## 2017-02-28 NOTE — ED Provider Notes (Signed)
HPI  SUBJECTIVE:  Manuel Cummings is a 71 y.o. male who presents with 10 days of bilateral medial ankle erythema, tingling, swelling.  Symptoms started after working outside.  He denies pain, temperature changes, color changes in his foot.  The swelling is intermittent, states some days it is not swollen at all.  There is no itching, burning.  No fevers, body aches, rash, redness elsewhere.  No tingling in his hands or around his mouth.  No change in medications.  He has never had symptoms like this before.  No alleviating factors.  He has not tried anything for this.  Symptoms are worse with hanging his feet in a dependent position.  It is not associated with walking.  Past medical history of anxiety.  No history of coronary artery disease, hypertension, peripheral vascular disease, peripheral arterial disease, diabetes, vasculitis, coagulopathy, kidney disease.  AST:[id number], [facility]     Past Medical History:  Diagnosis Date  . Anxiety     History reviewed. No pertinent surgical history.  No family history on file.  Social History  Substance Use Topics  . Smoking status: Former Smoker    Packs/day: 1.00    Years: 2.00    Quit date: 04/28/1962  . Smokeless tobacco: Never Used  . Alcohol use No    No current facility-administered medications for this encounter.   Current Outpatient Prescriptions:  .  amitriptyline (ELAVIL) 100 MG tablet, Take 300 mg by mouth at bedtime., Disp: , Rfl:  .  aspirin 81 MG tablet, Take 81 mg by mouth daily., Disp: , Rfl:  .  LORazepam (ATIVAN) 0.5 MG tablet, Take 0.5 mg by mouth every 8 (eight) hours., Disp: , Rfl:   Allergies  Allergen Reactions  . Codeine      ROS  As noted in HPI.   Physical Exam  BP (!) 143/78 (BP Location: Left Arm)   Pulse 69   Temp 98.3 F (36.8 C) (Oral)   Resp 16   Ht 6\' 2"  (1.88 m)   Wt 200 lb (90.7 kg)   SpO2 100%   BMI 25.68 kg/m   Constitutional: Well developed, well nourished, no acute  distress Eyes:  EOMI, conjunctiva normal bilaterally HENT: Normocephalic, atraumatic,mucus membranes moist Respiratory: Normal inspiratory effort Cardiovascular: Normal rate GI: nondistended skin: No rash, skin intact Musculoskeletal: no deformities   bilateral blanchable erythema along medial distal extremities/ankles with some bogginess.  No tenderness.  No increased temperature.  Skin intact.  No varicosities.  No petechiae.  Faint pink, warm, DP 2+ and equal bilaterally.  No other calf swelling, tenderness, edema.  Erythema improved with elevation.      Neurologic: Alert & oriented x 3, no focal neuro deficits Psychiatric: Speech and behavior appropriate   ED Course   Medications - No data to display  No orders of the defined types were placed in this encounter.   No results found for this or any previous visit (from the past 24 hour(s)). No results found.  ED Clinical Impression  Venous stasis dermatitis of both lower extremities   ED Assessment/Plan Presentation consistent with a venous stasis dermatitis.  No evidence of cellulitis or DVT.  We will have him do warm compresses, elevate, medium compression stockings.  Follow-up with PMD is needed.  Discussed  MDM, plan and followup with patient. patient agrees with plan.   No orders of the defined types were placed in this encounter.   *This clinic note was created using Dragon dictation software. Therefore,  there may be occasional mistakes despite careful proofreading.   ?    Melynda Ripple, MD 02/28/17 [phone]

## 2017-02-28 NOTE — Discharge Instructions (Signed)
Warm compresses, elevate your legs above your heart is much as possible, medium compression stockings.

## 2017-02-28 NOTE — ED Triage Notes (Signed)
Per patient bilateral sjihn/ankle redness. Per patient was working outside his home x over a week when he notice redness on bilateral ankle. Patient denies any pain / swelling or itching.

## 2018-04-07 ENCOUNTER — Encounter: Payer: Self-pay | Admitting: Emergency Medicine

## 2018-04-07 ENCOUNTER — Ambulatory Visit
Admission: EM | Admit: 2018-04-07 | Discharge: 2018-04-07 | Disposition: A | Discharge status: Home / Self Care | Payer: Medicare Other | Attending: Family Medicine | Admitting: Family Medicine

## 2018-04-07 ENCOUNTER — Other Ambulatory Visit: Payer: Self-pay

## 2018-04-07 DIAGNOSIS — R0981 Nasal congestion: Secondary | ICD-10-CM

## 2018-04-07 DIAGNOSIS — R05 Cough: Secondary | ICD-10-CM | POA: Diagnosis not present

## 2018-04-07 DIAGNOSIS — K140 Glossitis: Secondary | ICD-10-CM | POA: Insufficient documentation

## 2018-04-07 DIAGNOSIS — B349 Viral infection, unspecified: Secondary | ICD-10-CM | POA: Insufficient documentation

## 2018-04-07 MED ORDER — SULFURIC ACID-SULF PHENOLICS 30-50 % MT SOLN: OROMUCOSAL | 0 refills | Status: DC

## 2018-04-07 MED ORDER — BENZONATATE 100 MG PO CAPS: 100.0000 mg | ORAL_CAPSULE | Freq: Three times a day (TID) | ORAL | 0 refills | Status: DC | PRN

## 2018-04-07 NOTE — ED Provider Notes (Signed)
[facility]    CSN: [id number] Arrival date & time: 04/07/18  1647  History   Chief Complaint Chief Complaint  Patient presents with  . Cough  . Mouth Lesions   HPI 72 year old male presents with cough and congestion as well as a ulceration on his tongue.  Patient reports that he has had an ulceration on the lateral aspect of his tongue for approximately 3 weeks.  Is now starting to heal.  Patient states that for the past days he has had cough and associated congestion.  He has lost his voice.  Reports subjective fever.  No documented true fever.  No medications or interventions tried.  No known exacerbating factors.  No other complaints.  History reviewed as below. Past Medical History:  Diagnosis Date  . Anxiety    Social History Social History   Tobacco Use  . Smoking status: Former Smoker    Packs/day: 1.00    Years: 2.00    Pack years: 2.00    Last attempt to quit: 04/28/1962    Years since quitting: 55.9  . Smokeless tobacco: Never Used  Substance Use Topics  . Alcohol use: No  . Drug use: No    Allergies   Codeine   Review of Systems Review of Systems  HENT:       Tongue lesion.  Respiratory: Positive for cough.        Congestion.   Physical Exam Triage Vital Signs ED Triage Vitals  Enc Vitals Group     BP 04/07/18 1703 (!) 145/69     Pulse Rate 04/07/18 1703 82     Resp 04/07/18 1703 16     Temp 04/07/18 1703 99.3 F (37.4 C)     Temp Source 04/07/18 1703 Oral     SpO2 04/07/18 1703 100 %     Weight 04/07/18 1701 200 lb (90.7 kg)     Height 04/07/18 1701 6\' 2"  (1.88 m)     Head Circumference --      Peak Flow --      Pain Score 04/07/18 1701 0     Pain Loc --      Pain Edu? --      Excl. in [place]? --    Updated Vital Signs BP (!) 145/69 (BP Location: Left Arm)   Pulse 82   Temp 99.3 F (37.4 C) (Oral)   Resp 16   Ht 6\' 2"  (1.88 m)   Wt 90.7 kg   SpO2 100%   BMI 25.68 kg/m   Visual Acuity Right Eye Distance:   Left Eye  Distance:   Bilateral Distance:    Right Eye Near:   Left Eye Near:    Bilateral Near:     Physical Exam  Constitutional: He is oriented to person, place, and time. He appears well-developed. No distress.  HENT:  Head: Normocephalic and atraumatic.  Mouth/Throat: Oropharynx is clear and moist.  Tongue - Healing ulceration noted on the right lateral aspect.  Cardiovascular: Normal rate and regular rhythm.  Pulmonary/Chest: Effort normal and breath sounds normal. He has no wheezes. He has no rales.  Neurological: He is alert and oriented to person, place, and time.  Psychiatric: He has a normal mood and affect. His behavior is normal.  Nursing note and vitals reviewed.  UC Treatments / Results  Labs (all labs ordered are listed, but only abnormal results are displayed) Labs Reviewed - No data to display  EKG None  Radiology No results found.  Procedures Procedures (including critical care time)  Medications Ordered in UC Medications - No data to display  Initial Impression / Assessment and Plan / UC Course  I have reviewed the triage vital signs and the nursing notes.  Pertinent labs & imaging results that were available during my care of the patient were reviewed by me and considered in my medical decision making (see chart for details).    72 year old male presents with a viral illness. Debacterol for the cold sore.  Tessalon Perles as needed.  Supportive care.  Final Clinical Impressions(s) / UC Diagnoses   Final diagnoses:  Viral illness  Tongue ulceration     Discharge Instructions     Rest. Fluids.  Tylenol as needed.  Cough medication (Tessalon perles as directed).  Use the medication for your cold sore as directed.   Take care  Dr. Lacinda Axon    ED Prescriptions    Medication Sig Dispense Auth. Provider   benzonatate (TESSALON) 100 MG capsule Take 1 capsule (100 mg total) by mouth 3 (three) times daily as needed. 30 capsule Naiara Lombardozzi G, DO    Sulfuric Acid-Sulf Phenolics 73-56 % SOLN Use per package instructions. 2 each Coral Spikes, DO     Controlled Substance Prescriptions [place] Controlled Substance Registry consulted? Not Applicable   Coral Spikes, DO 04/07/18 2008

## 2018-04-07 NOTE — Discharge Instructions (Signed)
Rest. Fluids.  Tylenol as needed.  Cough medication (Tessalon perles as directed).  Use the medication for your cold sore as directed.   Take care  Dr. Lacinda Axon

## 2018-04-07 NOTE — ED Triage Notes (Signed)
Patient c/o cough, chest congestion for the past couple of days.  Patient also reports that he has a cold sore on his tongue that has not gone away.  Patient also reports fevers.

## 2021-01-08 ENCOUNTER — Emergency Department
Admission: EM | Admit: 2021-01-08 | Discharge: 2021-01-08 | Disposition: A | Discharge status: Home / Self Care | Payer: Medicare Other | Attending: Emergency Medicine | Admitting: Emergency Medicine

## 2021-01-08 ENCOUNTER — Encounter: Payer: Self-pay | Admitting: Emergency Medicine

## 2021-01-08 ENCOUNTER — Other Ambulatory Visit: Payer: Self-pay

## 2021-01-08 DIAGNOSIS — Z87891 Personal history of nicotine dependence: Secondary | ICD-10-CM | POA: Insufficient documentation

## 2021-01-08 DIAGNOSIS — R55 Syncope and collapse: Secondary | ICD-10-CM | POA: Insufficient documentation

## 2021-01-08 DIAGNOSIS — R5383 Other fatigue: Secondary | ICD-10-CM | POA: Diagnosis present

## 2021-01-08 DIAGNOSIS — D649 Anemia, unspecified: Secondary | ICD-10-CM

## 2021-01-08 DIAGNOSIS — D509 Iron deficiency anemia, unspecified: Secondary | ICD-10-CM | POA: Diagnosis not present

## 2021-01-08 LAB — BASIC METABOLIC PANEL
Anion gap: 10 (ref 5–15)
BUN: 41 mg/dL — ABNORMAL HIGH (ref 8–23)
CO2: 26 mmol/L (ref 22–32)
Calcium: 8.8 mg/dL — ABNORMAL LOW (ref 8.9–10.3)
Chloride: 100 mmol/L (ref 98–111)
Creatinine, Ser: 2.51 mg/dL — ABNORMAL HIGH (ref 0.61–1.24)
GFR, Estimated: 26 mL/min — ABNORMAL LOW (ref >60)
Glucose, Bld: 94 mg/dL (ref 70–99)
Potassium: 4.9 mmol/L (ref 3.5–5.1)
Sodium: 136 mmol/L (ref 135–145)

## 2021-01-08 LAB — CBC
HCT: 24.7 % — ABNORMAL LOW (ref 39.0–52.0)
HCT: 28.5 % — ABNORMAL LOW (ref 39.0–52.0)
Hemoglobin: 7.5 g/dL — ABNORMAL LOW (ref 13.0–17.0)
Hemoglobin: 8.7 g/dL — ABNORMAL LOW (ref 13.0–17.0)
MCH: 21.7 pg — ABNORMAL LOW (ref 26.0–34.0)
MCH: 23.1 pg — ABNORMAL LOW (ref 26.0–34.0)
MCHC: 30.4 g/dL (ref 30.0–36.0)
MCHC: 30.5 g/dL (ref 30.0–36.0)
MCV: 71.4 fL — ABNORMAL LOW (ref 80.0–100.0)
MCV: 75.8 fL — ABNORMAL LOW (ref 80.0–100.0)
Platelets: 334 10*3/uL (ref 150–400)
Platelets: 297 10*3/uL (ref 150–400)
RBC: 3.46 MIL/uL — ABNORMAL LOW (ref 4.22–5.81)
RBC: 3.76 MIL/uL — ABNORMAL LOW (ref 4.22–5.81)
RDW: 18 % — ABNORMAL HIGH (ref 11.5–15.5)
RDW: 20.1 % — ABNORMAL HIGH (ref 11.5–15.5)
WBC: 4.3 10*3/uL (ref 4.0–10.5)
WBC: 4.1 10*3/uL (ref 4.0–10.5)
nRBC: 0 % (ref 0.0–0.2)
nRBC: 0 % (ref 0.0–0.2)

## 2021-01-08 LAB — URINALYSIS, COMPLETE (UACMP) WITH MICROSCOPIC
Bacteria, UA: NONE SEEN
Bilirubin Urine: NEGATIVE
Glucose, UA: NEGATIVE mg/dL
Hgb urine dipstick: NEGATIVE
Ketones, ur: 5 mg/dL — AB
Leukocytes,Ua: NEGATIVE
Nitrite: NEGATIVE
Protein, ur: NEGATIVE mg/dL
Specific Gravity, Urine: 1.008 (ref 1.005–1.030)
Squamous Epithelial / LPF: NONE SEEN (ref 0–5)
pH: 6 (ref 5.0–8.0)

## 2021-01-08 LAB — PROTIME-INR
INR: 1 (ref 0.8–1.2)
Prothrombin Time: 13.4 seconds (ref 11.4–15.2)

## 2021-01-08 LAB — IRON AND TIBC
Iron: 12 ug/dL — ABNORMAL LOW (ref 45–182)
Saturation Ratios: 2 % — ABNORMAL LOW (ref 17.9–39.5)
TIBC: 512 ug/dL — ABNORMAL HIGH (ref 250–450)
UIBC: 500 ug/dL

## 2021-01-08 LAB — VITAMIN B12: Vitamin B-12: 1082 pg/mL — ABNORMAL HIGH (ref 180–914)

## 2021-01-08 LAB — ABO/RH: ABO/RH(D): O NEG

## 2021-01-08 LAB — FOLATE: Folate: 56 ng/mL (ref >5.9)

## 2021-01-08 LAB — FERRITIN: Ferritin: 3 ng/mL — ABNORMAL LOW (ref 24–336)

## 2021-01-08 LAB — PREPARE RBC (CROSSMATCH)

## 2021-01-08 MED ORDER — FERROUS SULFATE 325 (65 FE) MG PO TABS
325.0000 mg | ORAL_TABLET | Freq: Once | ORAL | Status: AC
  Administered 2021-01-08: 325 mg via ORAL
  Filled 2021-01-08: qty 1

## 2021-01-08 MED ORDER — SODIUM CHLORIDE 0.9% IV SOLUTION
Freq: Once | INTRAVENOUS | Status: DC
  Filled 2021-01-08: qty 250

## 2021-01-08 MED ORDER — AMLODIPINE BESYLATE 5 MG PO TABS
2.5000 mg | ORAL_TABLET | Freq: Once | ORAL | Status: AC
  Administered 2021-01-08: 2.5 mg via ORAL
  Filled 2021-01-08: qty 1

## 2021-01-08 NOTE — ED Triage Notes (Signed)
Pt reports he went to his PMD because he was yesterday after getting new B/P medication and weakness more than normal. He fell on Sunday, his PMD told him to stop taking the statin was still taking the B/P medication. They took blood then and they found that he is anemic and they told him to come here for a blood transfusion.

## 2021-01-08 NOTE — ED Provider Notes (Addendum)
[facility] Emergency Department Provider Note  ____________________________________________   Event Date/Time   First MD Initiated Contact with Patient 01/08/21 1341     (approximate)  I have reviewed the triage vital signs and the nursing notes.   HISTORY  Chief Complaint Fatigue  HPI Manuel Cummings is a 75 y.o. male presents to the ED for evaluation of generalized fatigue. He notes symptoms over the last year. He was recently started on a statin and a new BP med. He experienced a near-syncopal episode on Sunday. The patient would give history of ongoing symptoms of fatigue since the beginning of the year. He made contact with his PCP who suggested he discontinue his statin medicine. The pharmacist he consulted suggested he discontinue his BP medicine. He ultimately follow-up in person with his PCP who drew labs and called to confirm anemia. He was directed to the ED for blood transfusion.    Past Medical History:  Diagnosis Date   Anxiety     There are no problems to display for this patient.   History reviewed. No pertinent surgical history.  Prior to Admission medications   Medication Sig Start Date End Date Taking? Authorizing Provider  amitriptyline (ELAVIL) 100 MG tablet Take 300 mg by mouth at bedtime.    [provider]  aspirin 81 MG tablet Take 81 mg by mouth daily.    [provider]  benzonatate (TESSALON) 100 MG capsule Take 1 capsule (100 mg total) by mouth 3 (three) times daily as needed. 04/07/18   Coral Spikes, DO  Sulfuric Acid-Sulf Phenolics 123XX123 % SOLN Use per package instructions. 04/07/18   Coral Spikes, DO    Allergies Codeine and Penicillins  History reviewed. No pertinent family history.  Social History Social History   Tobacco Use   Smoking status: Former    Packs/day: 1.00    Years: 2.00    Pack years: 2.00    Types: Cigarettes    Quit date: 04/28/1962    Years since quitting: 58.7    Smokeless tobacco: Never  Vaping Use   Vaping Use: Never used  Substance Use Topics   Alcohol use: No   Drug use: No    Review of Systems  Constitutional: No fever/chills Eyes: No visual changes. ENT: No sore throat. Cardiovascular: Denies chest pain. Respiratory: Denies shortness of breath. Gastrointestinal: No abdominal pain.  No nausea, no vomiting.  No diarrhea.  No constipation. No melena, hemtochezia Genitourinary: Negative for dysuria. Musculoskeletal: Negative for back pain. Skin: Negative for rash. Neurological: Negative for headaches, focal weakness or numbness. Reports generalized weakness and fatigue.   ____________________________________________   PHYSICAL EXAM:  VITAL SIGNS: ED Triage Vitals [01/08/21 1206]  Enc Vitals Group     BP (!) 146/70     Pulse Rate 78     Resp 18     Temp 98.2 F (36.8 C)     Temp src      SpO2 98 %     Weight 194 lb (88 kg)     Height '6\' 2"'$  (1.88 m)     Head Circumference      Peak Flow      Pain Score 0     Pain Loc      Pain Edu?      Excl. in [place]?     Constitutional: Alert and oriented. Well appearing and in no acute distress. Eyes: Conjunctivae are normal. PERRL. EOMI. Head: Atraumatic. Nose: No congestion/rhinnorhea. Mouth/Throat: Mucous  membranes are moist.  Oropharynx non-erythematous. Neck: No stridor.   Cardiovascular: Normal rate, regular rhythm. Grossly normal heart sounds.  Good peripheral circulation. Respiratory: Normal respiratory effort.  No retractions. Lungs CTAB. Gastrointestinal: Soft and nontender. No distention. No abdominal bruits. No CVA tenderness. Musculoskeletal: No lower extremity tenderness nor edema.  No joint effusions. Neurologic:  Normal speech and language. No gross focal neurologic deficits are appreciated. No gait instability. Skin:  Skin is warm, dry and intact. No rash noted. Psychiatric: Mood and affect are normal. Speech and behavior are  normal.  ____________________________________________   LABS (all labs ordered are listed, but only abnormal results are displayed)  Labs Reviewed  BASIC METABOLIC PANEL - Abnormal; Notable for the following components:      Result Value   BUN 41 (*)    Creatinine, Ser 2.51 (*)    Calcium 8.8 (*)    GFR, Estimated 26 (*)    All other components within normal limits  CBC - Abnormal; Notable for the following components:   RBC 3.46 (*)    Hemoglobin 7.5 (*)    HCT 24.7 (*)    MCV 71.4 (*)    MCH 21.7 (*)    RDW 18.0 (*)    All other components within normal limits  URINALYSIS, COMPLETE (UACMP) WITH MICROSCOPIC - Abnormal; Notable for the following components:   Color, Urine STRAW (*)    APPearance CLEAR (*)    Ketones, ur 5 (*)    All other components within normal limits  IRON AND TIBC - Abnormal; Notable for the following components:   Iron 12 (*)    TIBC 512 (*)    Saturation Ratios 2 (*)    All other components within normal limits  FERRITIN - Abnormal; Notable for the following components:   Ferritin 3 (*)    All other components within normal limits  VITAMIN B12 - Abnormal; Notable for the following components:   Vitamin B-12 1,082 (*)    All other components within normal limits  CBC - Abnormal; Notable for the following components:   RBC 3.76 (*)    Hemoglobin 8.7 (*)    HCT 28.5 (*)    MCV 75.8 (*)    MCH 23.1 (*)    RDW 20.1 (*)    All other components within normal limits  FOLATE  PROTIME-INR  CBG MONITORING, ED  TYPE AND SCREEN  PREPARE RBC (CROSSMATCH)  ABO/RH   ____________________________________________  EKG  Vent. rate 77 BPM PR interval 188 ms QRS duration 104 ms QT/QTcB 384/434 ms P-R-T axes 74 87 92 ____________________________________________  RADIOLOGY I, Melvenia Needles, personally viewed and evaluated these images (plain radiographs) as part of my medical decision making, as well as reviewing the written report by the  radiologist.  ED MD interpretation:    Official radiology report(s): No results found.  ____________________________________________   PROCEDURES  Procedure(s) performed (including Critical Care):  Procedures  Ferous sulfate 325 mg PO Amlodipine 2.5 mg PO RBCs 1 unit IVPB ____________________________________________   INITIAL IMPRESSION / ASSESSMENT AND PLAN / ED COURSE  As part of my medical decision making, I reviewed the following data within the [place] reviewed as noted, EKG interpreted NSR, Evaluated by EM attending M. Jari Pigg, MD, and Notes from prior ED visits   Differential diagnosis includes, but is not limited to, alcohol, illicit or prescription medications, or other toxic ingestion; fever or infectious causes including sepsis; hypoxemia and/or hypercarbia; uremia; trauma; anemia, endocrine related  disorders such as diabetes, hypoglycemia, and thyroid-related diseases; etc.  Geriatric patient with ED evaluation of injuries including generalized fatigue and malaise, has been persistent for the last several months.  He was evaluated in the ED for his complaints, and found to have symptomatic anemia.  Patient with a severe anemia deficit as well as low iron, was evaluated and treated with a single unit of RBCs, transfused in the ED.  He had improvement of his initial hemoglobin hematocrit following the initial infusion.  Patient also endorses symptomatic improvement.  He started on an OTC iron supplement, and is advised to restart his blood pressure medicine.  He will follow-up with primary provider for ongoing symptoms.  Return precautions have been reviewed. ____________________________________________   FINAL CLINICAL IMPRESSION(S) / ED DIAGNOSES  Final diagnoses:  Symptomatic anemia  Iron deficiency anemia, unspecified iron deficiency anemia type     ED Discharge Orders     None        Note:  This document was prepared using Dragon  voice recognition software and may include unintentional dictation errors.    Melvenia Needles, PA-C 01/09/21 1912    Vanessa Westmoreland, MD 01/09/21 2203    Melvenia Needles, PA-C 01/10/21 0025    Vanessa , MD 01/10/21 [phone]

## 2021-01-08 NOTE — Discharge Instructions (Signed)
Your exam and labs are improving following your blood transusion

## 2021-01-08 NOTE — ED Notes (Signed)
Lying BP:  185/78  HR:  74  Sitting BP:  180/77  HR: 75  Standing BP:  161/69  HR: 76

## 2021-01-09 LAB — TYPE AND SCREEN
ABO/RH(D): O NEG
Antibody Screen: NEGATIVE
Unit division: 0

## 2021-01-09 LAB — BPAM RBC
Blood Product Expiration Date: 202210102359
ISSUE DATE / TIME: 202209131704
Unit Type and Rh: 5100

## 2021-01-15 ENCOUNTER — Other Ambulatory Visit: Payer: Self-pay

## 2021-01-15 ENCOUNTER — Encounter: Payer: Self-pay | Admitting: Emergency Medicine

## 2021-01-15 ENCOUNTER — Ambulatory Visit (INDEPENDENT_AMBULATORY_CARE_PROVIDER_SITE_OTHER): Payer: Medicare Other

## 2021-01-15 ENCOUNTER — Ambulatory Visit
Admission: EM | Admit: 2021-01-15 | Discharge: 2021-01-15 | Disposition: A | Discharge status: Home / Self Care | Payer: Medicare Other

## 2021-01-15 DIAGNOSIS — R0609 Other forms of dyspnea: Secondary | ICD-10-CM

## 2021-01-15 DIAGNOSIS — R0600 Dyspnea, unspecified: Secondary | ICD-10-CM

## 2021-01-15 DIAGNOSIS — R5383 Other fatigue: Secondary | ICD-10-CM | POA: Diagnosis not present

## 2021-01-15 DIAGNOSIS — I10 Essential (primary) hypertension: Secondary | ICD-10-CM | POA: Insufficient documentation

## 2021-01-15 HISTORY — DX: Essential (primary) hypertension: I10

## 2021-01-15 NOTE — Discharge Instructions (Addendum)
Your chest x-ray did not show any abnormalities that would explain your shortness of breath.  As we have discussed, this may be related to your anemia.  You had mentioned that you feel your head does not feel right since you increase your amlodipine to twice daily.  I recommend reducing your dose to once daily and then following up next week with your primary care provider to discuss either a dosage adjustment or change in medication.  Keep your follow-up appointments for your stress echo as scheduled as well as your appointment with gastroenterology for evaluation of a potential GI source for your blood loss.

## 2021-01-15 NOTE — ED Provider Notes (Signed)
[facility]    CSN: [id number] Arrival date & time: 01/15/21  1732      History   Chief Complaint Chief Complaint  Patient presents with   Fatigue   Shortness of Breath    HPI Manuel Cummings is a 75 y.o. male.   HPI  75 year old male here for evaluation of fatigue and shortness of breath.  Patient reports that he has been experiencing fatigue and shortness of breath for the last 7 to 8 months.  This is not associated with chest pain, fever, palpitations, syncope or presyncope, edema in his extremities, headache, or dizziness.  He states that the dyspnea is most often on moderate exertion but he states the last few days he feels a little short winded at varying times throughout the day including at rest.  She also reports that last night he said he felt like he had shivering in the blood in his body but he had no shaking externally.  He also states that his head just does not feel right but he cannot quantify what he is feeling.  Patient was evaluated by cardiology on 01/04/2021 for shortness of breath and had a normal EKG at that time.  A stress echo was ordered and patient is scheduled for that in October.  Patient was evaluated on 9/12 by Dr. Victory Dakin in his office for fatigue and blood work ordered.  At that time his H&H was 7.8 and 27.5.  The following day 01/08/2021 he was seen in the emergency department and treated with a single unit of packed RBCs for symptomatic anemia.  2 days later on 01/10/2021 he saw his primary care provider due to concerns over high blood pressure readings.  He had recently been decreased on his amlodipine to 2.5 mg daily.  At the last visit due to the elevated blood pressure reading and the history of GI bleed patient was advised to resume taking his amlodipine 2.5 mg twice daily.  He had a repeat CBC which showed an improvement of his H&H yesterday to 9.3 and 30.7.  Past Medical History:  Diagnosis Date   Anxiety    Hypertension     Patient  Active Problem List   Diagnosis Date Noted   Hypertension 01/15/2021    History reviewed. No pertinent surgical history.     Home Medications    Prior to Admission medications   Medication Sig Start Date End Date Taking? Authorizing Provider  amLODipine (NORVASC) 2.5 MG tablet Take by mouth. 01/10/21 01/10/22 Yes [provider]  amitriptyline (ELAVIL) 100 MG tablet Take 300 mg by mouth at bedtime.    [provider]  aspirin 81 MG tablet Take 81 mg by mouth daily.    [provider]  benzonatate (TESSALON) 100 MG capsule Take 1 capsule (100 mg total) by mouth 3 (three) times daily as needed. 04/07/18   Coral Spikes, DO  Sulfuric Acid-Sulf Phenolics 78-29 % SOLN Use per package instructions. 04/07/18   Coral Spikes, DO    Family History History reviewed. No pertinent family history.  Social History Social History   Tobacco Use   Smoking status: Former    Packs/day: 1.00    Years: 2.00    Pack years: 2.00    Types: Cigarettes    Quit date: 04/28/1962    Years since quitting: 58.7   Smokeless tobacco: Never  Vaping Use   Vaping Use: Never used  Substance Use Topics   Alcohol use: No   Drug  use: No     Allergies   Codeine and Penicillins   Review of Systems Review of Systems  Constitutional:  Positive for fatigue. Negative for activity change and appetite change.  Respiratory:  Positive for shortness of breath. Negative for wheezing.   Cardiovascular:  Negative for chest pain, palpitations and leg swelling.  Neurological:  Negative for dizziness, syncope, weakness and headaches.  Hematological: Negative.   Psychiatric/Behavioral: Negative.      Physical Exam Triage Vital Signs ED Triage Vitals  Enc Vitals Group     BP      Pulse      Resp      Temp      Temp src      SpO2      Weight      Height      Head Circumference      Peak Flow      Pain Score      Pain Loc      Pain Edu?      Excl. in [place]?    No data  found.  Updated Vital Signs BP (!) 157/88 (BP Location: Left Arm)   Pulse 81   Temp 98.1 F (36.7 C) (Oral)   Resp 18   SpO2 99%   Visual Acuity Right Eye Distance:   Left Eye Distance:   Bilateral Distance:    Right Eye Near:   Left Eye Near:    Bilateral Near:     Physical Exam Vitals and nursing note reviewed.  Constitutional:      General: He is not in acute distress.    Appearance: Normal appearance. He is normal weight. He is not ill-appearing.  HENT:     Head: Normocephalic and atraumatic.  Eyes:     Conjunctiva/sclera: Conjunctivae normal.  Cardiovascular:     Rate and Rhythm: Normal rate and regular rhythm.     Pulses: Normal pulses.     Heart sounds: Normal heart sounds. No murmur heard.   No gallop.  Pulmonary:     Effort: Pulmonary effort is normal.     Breath sounds: Normal breath sounds. No wheezing, rhonchi or rales.  Skin:    General: Skin is warm and dry.     Capillary Refill: Capillary refill takes less than 2 seconds.     Findings: No erythema or rash.  Neurological:     General: No focal deficit present.     Mental Status: He is alert and oriented to person, place, and time.  Psychiatric:        Mood and Affect: Mood normal.        Behavior: Behavior normal.        Thought Content: Thought content normal.        Judgment: Judgment normal.     UC Treatments / Results  Labs (all labs ordered are listed, but only abnormal results are displayed) Labs Reviewed - No data to display  EKG Normal sinus rhythm with a ventricular rate of 81 bpm PR interval 188 ms QRS duration 102 ms QT/QTc 386/448 ms Normal axis, no ST or T wave abnormality.   Radiology DG Chest 2 View  Result Date: 01/15/2021 CLINICAL DATA:  Dyspnea EXAM: CHEST - 2 VIEW COMPARISON:  None. FINDINGS: The heart size and mediastinal contours are within normal limits. No focal pulmonary opacity. No pleural effusion or pneumothorax. No acute osseous abnormality. Degenerative  changes in the spine. IMPRESSION: No active cardiopulmonary disease. Electronically Signed   By: Bryson Ha  Vasan M.D.   On: 01/15/2021 19:31    Procedures Procedures (including critical care time)  Medications Ordered in UC Medications - No data to display  Initial Impression / Assessment and Plan / UC Course  I have reviewed the triage vital signs and the nursing notes.  Pertinent labs & imaging results that were available during my care of the patient were reviewed by me and considered in my medical decision making (see chart for details).  Patient is a nontoxic though anxious appearing 75 year old male here for evaluation of his continued fatigue and shortness of breath that has been present for the past 7 to 8 months and is currently being evaluated by his primary care provider, cardiology, and is awaiting a gastroenterology referral.  Patient is scheduled for a stress echo on 01/29/2021.  He was evaluated by cardiology 11 days ago and had a normal EKG that showed normal sinus rhythm without ST or T wave abnormality.  Patient does not experience any chest pain, palpitations, syncope, presyncope, or edema of extremities.  He also denies any melena stool or dark tarry stools.  He is currently taking iron for his iron deficiency anemia.  He has a history of CKD stage IV.  Patient states that he just wants the cause of his fatigue and shortness of breath to be found.  There has not been any significant increase or change to his symptom presentation of the last 7 to 8 months though he states that he did feel short of breath sitting in the exam room.  Patient did not exhibit any tachypnea or dyspnea.  He can speak in full sentences for long monologues without any difficulty.  Patient is not pale, nor are his conjunctiva.  Heart sounds are S1-S2 without murmur, rub, or gallop.  Lung sounds are clear to auscultation in all fields.  EKG collected at triage was compared to EKG from 01/08/2021 and there is no  change.  Advised patient that his symptoms may be related to his anemia which could be coming from his chronic kidney disease.  Without evidence of melena or dark tarry stools there is no really indicated source that patient has a GI bleed or that his anemia is coming from a GI source.  GI consult is pending.  Blood work from yesterday shows that his H&H has shown interval improvement over the last 7 days.  I explained to the patient that I am unable to check labs to the hour the night and the blood work missed days reassuring that his anemia is improving.  His EKG is unremarkable.  Patient states that he thinks is coming from his blood pressure medication since he increased it to twice daily.  Will perform chest x-ray to look for organic causes of his dyspnea as there is no indication that a chest x-ray has been performed over the last 7 to 8 months.  If the chest x-ray is negative plan is to decrease patient's amlodipine back to once daily and have him follow-up with his primary care provider later this week.  Chest x-ray independently reviewed and evaluated by me.  Impression: Airspaces are well-pneumatized.  There is no evidence of infiltrate or effusion noted.  Radiology overread pending. Radiology impression is no active cardiopulmonary process.  Will discharge patient home to follow-up with his primary care provider.  Patient states that his head has not felt right since he increase his amlodipine to twice daily so I will have him decrease it back to once daily and  speak with Dr. Kym Groom about dosage adjustment or change in medication.   Final Clinical Impressions(s) / UC Diagnoses   Final diagnoses:  Fatigue, unspecified type  Dyspnea on exertion     Discharge Instructions      Your chest x-ray did not show any abnormalities that would explain your shortness of breath.  As we have discussed, this may be related to your anemia.  You had mentioned that you feel your head does not feel  right since you increase your amlodipine to twice daily.  I recommend reducing your dose to once daily and then following up next week with your primary care provider to discuss either a dosage adjustment or change in medication.  Keep your follow-up appointments for your stress echo as scheduled as well as your appointment with gastroenterology for evaluation of a potential GI source for your blood loss.     ED Prescriptions   None    PDMP not reviewed this encounter.   Margarette Canada, NP 01/15/21 [phone]

## 2021-01-15 NOTE — ED Triage Notes (Signed)
Pt presents today with c/o of fatigue and shortness of breath that's been ongoing for 7-8 months. Denies chest pain or fever.   He reports difficulty with ambulation d/t SOB. He called PCP today and they told him to come to [facility] or ER.

## 2021-01-20 ENCOUNTER — Other Ambulatory Visit: Payer: Self-pay

## 2021-01-20 ENCOUNTER — Ambulatory Visit
Admission: EM | Admit: 2021-01-20 | Discharge: 2021-01-20 | Disposition: A | Discharge status: Home / Self Care | Payer: Medicare Other

## 2021-01-20 DIAGNOSIS — F419 Anxiety disorder, unspecified: Secondary | ICD-10-CM

## 2021-01-20 MED ORDER — HYDROXYZINE HCL 25 MG PO TABS: 25.0000 mg | ORAL_TABLET | Freq: Three times a day (TID) | ORAL | 0 refills | Status: DC | PRN

## 2021-01-20 NOTE — ED Provider Notes (Signed)
[facility]    CSN: [id number] Arrival date & time: 01/20/21  1412      History   Chief Complaint Chief Complaint  Patient presents with   Anxiety    HPI Manuel Cummings is a 75 y.o. male.   HPI  75 year old male here for evaluation of anxiety.  Patient is here with his son and is reporting an increase in his anxiety.  He states that he is overwhelmed, he is pacing in the exam room, he states that the overwhelming anxiety sensation started to ramp up last week.  He is tried physical activity at home without any improvement of symptoms.  He denies SI or HI.  He was evaluated by Dr. Kym Groom on 01/16/2021 and was started on BuSpar 5 mg twice daily.  He was started on low-dose secondary to his renal impairment.  Patient report sent to Dr. Genene Churn that he started feel anxious and depressed after having to go to the emergency room to receive a transfusion for his anemia and after finding out that his creatinine had worsened and his GFR had decreased.  Patient has a history of anxiety and depression.  He has been on amitriptyline for a number of years and used to take a benzodiazepine.  He has stopped taking the benzodiazepine in the interim.  Per Dr. Brandt Loosen note they discussed possibly increasing BuSpar in a few weeks after reassessment and or possibly adding Klonopin.  Patient states that he needs something done tonight.  Past Medical History:  Diagnosis Date   Anxiety    Hypertension     Patient Active Problem List   Diagnosis Date Noted   Hypertension 01/15/2021    History reviewed. No pertinent surgical history.     Home Medications    Prior to Admission medications   Medication Sig Start Date End Date Taking? Authorizing Provider  amitriptyline (ELAVIL) 100 MG tablet Take 300 mg by mouth at bedtime.   Yes [provider]  amLODipine (NORVASC) 2.5 MG tablet Take by mouth. 01/10/21 01/10/22 Yes [provider]  aspirin 81 MG tablet Take 81 mg by  mouth daily.   Yes [provider]  busPIRone (BUSPAR) 5 MG tablet Take 5 mg by mouth 2 (two) times daily. 01/16/21  Yes [provider]  doxycycline (VIBRA-TABS) 100 MG tablet Take 100 mg by mouth 2 (two) times daily. 01/17/21  Yes [provider]  hydrOXYzine (ATARAX/VISTARIL) 25 MG tablet Take 1 tablet (25 mg total) by mouth every 8 (eight) hours as needed. 01/20/21  Yes Margarette Canada, NP  rosuvastatin (CRESTOR) 20 MG tablet Take 20 mg by mouth daily. 01/02/21  Yes [provider]  tamsulosin (FLOMAX) 0.4 MG CAPS capsule Take by mouth. 01/16/21 01/16/22 Yes [provider]    Family History History reviewed. No pertinent family history.  Social History Social History   Tobacco Use   Smoking status: Former    Packs/day: 1.00    Years: 2.00    Pack years: 2.00    Types: Cigarettes    Quit date: 04/28/1962    Years since quitting: 58.7   Smokeless tobacco: Never  Vaping Use   Vaping Use: Never used  Substance Use Topics   Alcohol use: No   Drug use: No     Allergies   Codeine and Penicillins   Review of Systems Review of Systems  Constitutional:  Negative for activity change and appetite change.  Psychiatric/Behavioral:  Negative for self-injury and suicidal ideas. The patient  is nervous/anxious.     Physical Exam Triage Vital Signs ED Triage Vitals  Enc Vitals Group     BP 01/20/21 1432 131/68     Pulse Rate 01/20/21 1432 81     Resp 01/20/21 1432 18     Temp 01/20/21 1432 98.9 F (37.2 C)     Temp Source 01/20/21 1432 Oral     SpO2 01/20/21 1432 98 %     Weight 01/20/21 1429 185 lb (83.9 kg)     Height 01/20/21 1429 6\' 2"  (1.88 m)     Head Circumference --      Peak Flow --      Pain Score 01/20/21 1429 0     Pain Loc --      Pain Edu? --      Excl. in [place]? --    No data found.  Updated Vital Signs BP 131/68 (BP Location: Left Arm)   Pulse 81   Temp 98.9 F (37.2 C) (Oral)   Resp 18   Ht 6\' 2"  (1.88 m)   Wt 185  lb (83.9 kg)   SpO2 98%   BMI 23.75 kg/m   Visual Acuity Right Eye Distance:   Left Eye Distance:   Bilateral Distance:    Right Eye Near:   Left Eye Near:    Bilateral Near:     Physical Exam Vitals and nursing note reviewed.  Constitutional:      General: He is not in acute distress.    Appearance: Normal appearance. He is normal weight. He is not toxic-appearing.  HENT:     Head: Normocephalic and atraumatic.  Neurological:     General: No focal deficit present.     Mental Status: He is alert and oriented to person, place, and time.  Psychiatric:     Comments: Patient is anxious and pacing in the exam room.  His speech is not pressured and he does have a coherent train of thought.  Patient has inquired about increasing his amitriptyline dose or possibly going to the ER for treatment.  No HI or SI verbalized by patient.  He is here with his son.     UC Treatments / Results  Labs (all labs ordered are listed, but only abnormal results are displayed) Labs Reviewed - No data to display  EKG   Radiology No results found.  Procedures Procedures (including critical care time)  Medications Ordered in UC Medications - No data to display  Initial Impression / Assessment and Plan / UC Course  I have reviewed the triage vital signs and the nursing notes.  Pertinent labs & imaging results that were available during my care of the patient were reviewed by me and considered in my medical decision making (see chart for details).  Patient is a nontoxic though anxious appearing 75 year old male here for evaluation of anxiety symptoms that have increased over the past week as discussed in the HPI above.  Patient has been evaluated by his primary care provider for this and was started on BuSpar 5 mg twice daily 4 days ago.  He states that it has not had any effect.  He just feels overwhelmed.  At patient's recent visit on 01/16/2021 he had a PHQ-9 survey done which showed a score of  8 which equals mild depression.  He has a longstanding history of dealing with anxiety and depression.  Per Dr. Brandt Loosen note there was a discussion about possibly using clonazepam and the risks versus benefits especially given  his advanced age.  I have advised the patient that we do not prescribe benzodiazepines from the urgent care as there is no way for [place] to monitor medication effects or progress.  We discussed the possibility of starting hydroxyzine which is more of an immediate acting medication with less risks of possible addiction or syncope.  Using patient's recent blood work from 01/08/2019 to calculated creatinine clearance is 31 mL/min.  Per up-to-date this calls for a dosage reduction by 50% of normal dose.  Typical dose for hydroxyzine is 50-100 mg every 6 hours.  We will start patient on 25 mg every 8 hours and have him follow-up with Dr. Victory Dakin next week.  I did advise the patient that if he does not feel he is getting any relief from this medication that he may very well need to seek care in the ER as we are unable to provide anything additional in the urgent care.  Patient and son verbalized understanding.  Patient left in stable condition and ambulatory fashion.   Final Clinical Impressions(s) / UC Diagnoses   Final diagnoses:  Anxiety     Discharge Instructions      Continue your Amitriptyline and the Buspar at your current dosage.  Add the Hydroxyzine 25 mg every 8 hours as needed for anxiety.  Call Dr. Kenyon Ana in the morning to discuss dosage adjustments to your Amitriptyline and Buspar.   If you do not feel like you are getting any relief from the hydroxyzine, and he feels as if your anxiety is worsening, please seek care in the emergency department.     ED Prescriptions     Medication Sig Dispense Auth. Provider   hydrOXYzine (ATARAX/VISTARIL) 25 MG tablet Take 1 tablet (25 mg total) by mouth every 8 (eight) hours as needed. 12 tablet Margarette Canada, NP      PDMP  not reviewed this encounter.   Margarette Canada, NP 01/20/21 [phone]

## 2021-01-20 NOTE — Discharge Instructions (Addendum)
Continue your Amitriptyline and the Buspar at your current dosage.  Add the Hydroxyzine 25 mg every 8 hours as needed for anxiety.  Call Dr. Kenyon Ana in the morning to discuss dosage adjustments to your Amitriptyline and Buspar.   If you do not feel like you are getting any relief from the hydroxyzine, and he feels as if your anxiety is worsening, please seek care in the emergency department.

## 2021-01-20 NOTE — ED Triage Notes (Signed)
Pt c/o increasing anxiety for several weeks. Pt states his anxiety has been increasing recently, along with some intermittent depression. Pt states that the last 3 days he has felt "overwhelmed". Pt saw his PCP and was put on Buspar 5mg  BID, he has been taking this as directed. Pt is begging for help, states he cannot continue to feel this way.

## 2021-02-19 ENCOUNTER — Other Ambulatory Visit: Payer: Self-pay | Admitting: Nephrology

## 2021-02-19 DIAGNOSIS — N1832 Chronic kidney disease, stage 3b: Secondary | ICD-10-CM

## 2021-03-05 ENCOUNTER — Emergency Department
Admission: EM | Admit: 2021-03-05 | Discharge: 2021-03-05 | Discharge status: Against Medical Advice | Payer: Medicare Other | Source: Home / Self Care

## 2021-03-05 ENCOUNTER — Ambulatory Visit: Payer: Medicare Other

## 2021-03-25 ENCOUNTER — Ambulatory Visit: Admit: 2021-03-25 | Payer: Medicare Other

## 2021-03-25 SURGERY — ESOPHAGOGASTRODUODENOSCOPY (EGD) WITH PROPOFOL
Anesthesia: General

## 2021-04-05 ENCOUNTER — Emergency Department
Admission: EM | Admit: 2021-04-05 | Discharge: 2021-04-06 | Disposition: A | Discharge status: Home / Self Care | Payer: Medicare Other | Attending: Emergency Medicine | Admitting: Emergency Medicine

## 2021-04-05 ENCOUNTER — Other Ambulatory Visit: Payer: Self-pay

## 2021-04-05 ENCOUNTER — Encounter: Payer: Self-pay | Admitting: Emergency Medicine

## 2021-04-05 DIAGNOSIS — Y900 Blood alcohol level of less than 20 mg/100 ml: Secondary | ICD-10-CM | POA: Diagnosis not present

## 2021-04-05 DIAGNOSIS — I10 Essential (primary) hypertension: Secondary | ICD-10-CM | POA: Insufficient documentation

## 2021-04-05 DIAGNOSIS — Z79899 Other long term (current) drug therapy: Secondary | ICD-10-CM | POA: Diagnosis not present

## 2021-04-05 DIAGNOSIS — Z87891 Personal history of nicotine dependence: Secondary | ICD-10-CM | POA: Insufficient documentation

## 2021-04-05 DIAGNOSIS — F32A Depression, unspecified: Secondary | ICD-10-CM | POA: Diagnosis not present

## 2021-04-05 LAB — COMPREHENSIVE METABOLIC PANEL
ALT: 22 U/L (ref 0–44)
AST: 28 U/L (ref 15–41)
Albumin: 3.6 g/dL (ref 3.5–5.0)
Alkaline Phosphatase: 52 U/L (ref 38–126)
Anion gap: 7 (ref 5–15)
BUN: 31 mg/dL — ABNORMAL HIGH (ref 8–23)
CO2: 26 mmol/L (ref 22–32)
Calcium: 9.1 mg/dL (ref 8.9–10.3)
Chloride: 99 mmol/L (ref 98–111)
Creatinine, Ser: 1.75 mg/dL — ABNORMAL HIGH (ref 0.61–1.24)
GFR, Estimated: 40 mL/min — ABNORMAL LOW (ref >60)
Glucose, Bld: 111 mg/dL — ABNORMAL HIGH (ref 70–99)
Potassium: 4.9 mmol/L (ref 3.5–5.1)
Sodium: 132 mmol/L — ABNORMAL LOW (ref 135–145)
Total Bilirubin: 0.6 mg/dL (ref 0.3–1.2)
Total Protein: 6.4 g/dL — ABNORMAL LOW (ref 6.5–8.1)

## 2021-04-05 LAB — CBC
HCT: 31.5 % — ABNORMAL LOW (ref 39.0–52.0)
Hemoglobin: 10.3 g/dL — ABNORMAL LOW (ref 13.0–17.0)
MCH: 30.9 pg (ref 26.0–34.0)
MCHC: 32.7 g/dL (ref 30.0–36.0)
MCV: 94.6 fL (ref 80.0–100.0)
Platelets: 319 10*3/uL (ref 150–400)
RBC: 3.33 MIL/uL — ABNORMAL LOW (ref 4.22–5.81)
RDW: 17.4 % — ABNORMAL HIGH (ref 11.5–15.5)
WBC: 6.5 10*3/uL (ref 4.0–10.5)
nRBC: 0 % (ref 0.0–0.2)

## 2021-04-05 LAB — URINE DRUG SCREEN, QUALITATIVE (ARMC ONLY)
Amphetamines, Ur Screen: NOT DETECTED
Barbiturates, Ur Screen: NOT DETECTED
Benzodiazepine, Ur Scrn: POSITIVE — AB
Cannabinoid 50 Ng, Ur ~~LOC~~: NOT DETECTED
Cocaine Metabolite,Ur ~~LOC~~: NOT DETECTED
MDMA (Ecstasy)Ur Screen: NOT DETECTED
Methadone Scn, Ur: NOT DETECTED
Opiate, Ur Screen: NOT DETECTED
Phencyclidine (PCP) Ur S: NOT DETECTED
Tricyclic, Ur Screen: NOT DETECTED

## 2021-04-05 LAB — ACETAMINOPHEN LEVEL: Acetaminophen (Tylenol), Serum: <10 ug/mL — ABNORMAL LOW (ref 10–30)

## 2021-04-05 LAB — ETHANOL: Alcohol, Ethyl (B): <10 mg/dL (ref <10)

## 2021-04-05 LAB — SALICYLATE LEVEL: Salicylate Lvl: <7 mg/dL — ABNORMAL LOW (ref 7.0–30.0)

## 2021-04-05 MED ORDER — LORAZEPAM 1 MG PO TABS
1.0000 mg | ORAL_TABLET | Freq: Once | ORAL | Status: AC
  Administered 2021-04-05: 1 mg via ORAL
  Filled 2021-04-05: qty 1

## 2021-04-05 MED ORDER — MELATONIN 3 MG PO TABS: 3.0000 mg | ORAL_TABLET | Freq: Every day | ORAL | 0 refills | Status: AC

## 2021-04-05 NOTE — ED Notes (Signed)
Pt has stopped pacing.

## 2021-04-05 NOTE — Discharge Instructions (Addendum)
Our psychiatry team recommends trying melatonin every night in addition to your other medicines to help with your sleep issues.  Continue following up with your primary care doctor and psychiatrist in the office.

## 2021-04-05 NOTE — BH Assessment (Addendum)
Comprehensive Clinical Assessment (CCA) Note  04/05/2021 Manuel Cummings [id number]  Chief Complaint: Patient is a 75 year old male presenting to [facility] ED voluntarily. Per triage note Pt comes into the ED via POV with his son for voluntary psychiatric evaluation.  Pt states he was recently released from psychiatric inpatient at [facility] where they adjusted some of his medications including Seroquel, Trazadone, etc.  Pt states he was having problems with sleeping initially and that's why he went to [facility].  He is now sleeping, but feels sick from all the medications and needs and adjustment with his psych meds.  Pt does not want to be sent home. During assessment patient appears alert and oriented x4, calm and cooperative. Patient reports "this week has been terrible, I need some help, they increased my Trazadone from 100mg  to 200mg ." "All I do is walk, I don't eat, I don't want to do anything, I have no motivation." Patient reports in regards to his sleep "I go to sleep at midnight, I only sleep 4-5 hours and then I'm up the rest of the day, I get no relief or no relaxation." Patient is currently being followed by [facility] but patient reports that he has not expressed this to his doctor. Patient reports that he needs to be admitted "I need to be monitored so that my medications can be adjusted, I do not want to go back home and have another day like this week." Patient denies SI/HI/AH/VH and does not appear to be responding to any internal or external stimuli.  Per Psyc NP Leandro Reasoner patient does not meet criteria for Inpatient Chief Complaint  Patient presents with   Psychiatric Evaluation   Visit Diagnosis: Anxiety and Depression by hx    CCA Screening, Triage and Referral (STR)  Patient Reported Information How did you hear about [place]? Self  Referral name: No data recorded Referral phone number: No data recorded  Whom do you see for routine medical problems? No data  recorded Practice/Facility Name: No data recorded Practice/Facility Phone Number: No data recorded Name of Contact: No data recorded Contact Number: No data recorded Contact Fax Number: No data recorded Prescriber Name: No data recorded Prescriber Address (if known): No data recorded  What Is the Reason for Your Visit/Call Today? Patient presents voluntarily due to a medication adjustment  How Long Has This Been Causing You Problems? 1 wk - 1 month  What Do You Feel Would Help You the Most Today? No data recorded  Have You Recently Been in Any Inpatient Treatment (Hospital/Detox/Crisis Center/28-Day Program)? No data recorded Name/Location of Program/Hospital:No data recorded How Long Were You There? No data recorded When Were You Discharged? No data recorded  Have You Ever Received Services From [facility] Before? No data recorded Who Do You See at [facility]? No data recorded  Have You Recently Had Any Thoughts About Hurting Yourself? No  Are You Planning to Commit Suicide/Harm Yourself At This time? No   Have you Recently Had Thoughts About [place]? No  Explanation: No data recorded  Have You Used Any Alcohol or Drugs in the Past 24 Hours? No  How Long Ago Did You Use Drugs or Alcohol? No data recorded What Did You Use and How Much? No data recorded  Do You Currently Have a Therapist/Psychiatrist? Yes  Name of Therapist/Psychiatrist: [facility]   Have You Been Recently Discharged From Any Office Practice or Programs? No  Explanation of Discharge From Practice/Program: No data recorded  CCA Screening Triage Referral Assessment Type of Contact: Face-to-Face  Is this Initial or Reassessment? No data recorded Date Telepsych consult ordered in [facility]:  No data recorded Time Telepsych consult ordered in [facility]:  No data recorded  Patient Reported Information Reviewed? No data recorded Patient Left Without Being Seen? No data recorded Reason for Not Completing  Assessment: No data recorded  Collateral Involvement: No data recorded  Does Patient Have a [place]? No data recorded Name and Contact of Legal Guardian: No data recorded If Minor and Not Living with Parent(s), Who has Custody? No data recorded Is CPS involved or ever been involved? Never  Is APS involved or ever been involved? Never   Patient Determined To Be At Risk for Harm To Self or Others Based on Review of Patient Reported Information or Presenting Complaint? No  Method: No data recorded Availability of Means: No data recorded Intent: No data recorded Notification Required: No data recorded Additional Information for Danger to Others Potential: No data recorded Additional Comments for Danger to Others Potential: No data recorded Are There Guns or Other Weapons in Your Home? No data recorded Types of Guns/Weapons: No data recorded Are These Weapons Safely Secured?                            No data recorded Who Could Verify You Are Able To Have These Secured: No data recorded Do You Have any Outstanding Charges, Pending Court Dates, Parole/Probation? No data recorded Contacted To Inform of Risk of Harm To Self or Others: No data recorded  Location of Assessment: [facility] ED   Does Patient Present under Involuntary Commitment? No  IVC Papers Initial File Date: No data recorded  [place] of Residence: [place]   Patient Currently Receiving the Following Services: Medication Management   Determination of Need: Emergent (2 hours)   Options For Referral: No data recorded    CCA Biopsychosocial Intake/Chief Complaint:  No data recorded Current Symptoms/Problems: No data recorded  Patient Reported Schizophrenia/Schizoaffective Diagnosis in Past: No   Strengths: Patient is able to communicate his needs  Preferences: No data recorded Abilities: No data recorded  Type of Services Patient Feels are Needed: No data recorded  Initial Clinical  Notes/Concerns: No data recorded  Mental Health Symptoms Depression:   Change in energy/activity; Hopelessness; Sleep (too much or little); Fatigue   Duration of Depressive symptoms:  Greater than two weeks   Mania:   None   Anxiety:    Difficulty concentrating; Fatigue   Psychosis:   None   Duration of Psychotic symptoms: No data recorded  Trauma:   None   Obsessions:   None   Compulsions:   None   Inattention:   None   Hyperactivity/Impulsivity:   None   Oppositional/Defiant Behaviors:   None   Emotional Irregularity:   None   Other Mood/Personality Symptoms:  No data recorded   Mental Status Exam Appearance and self-care  Stature:   Average   Weight:   Average weight   Clothing:   Casual   Grooming:   Normal   Cosmetic use:   None   Posture/gait:   Normal   Motor activity:   Not Remarkable   Sensorium  Attention:   Normal   Concentration:   Normal   Orientation:   X5   Recall/memory:   Normal   Affect and Mood  Affect:   Appropriate   Mood:   Anxious  Relating  Eye contact:   Normal   Facial expression:   Responsive   Attitude toward examiner:   Cooperative   Thought and Language  Speech flow:  Clear and Coherent   Thought content:   Appropriate to Mood and Circumstances   Preoccupation:   None   Hallucinations:   None   Organization:  No data recorded  [employer] of Knowledge:   Fair   Intelligence:   Average   Abstraction:   Concrete   Judgement:   Fair   Reality Testing:   Realistic   Insight:   Good   Decision Making:   Normal   Social Functioning  Social Maturity:   Responsible   Social Judgement:   Normal   Stress  Stressors:   Other (Comment)   Coping Ability:   Normal   Skill Deficits:   None   Supports:   Family     Religion: Religion/Spirituality Are You A Religious Person?: No  Leisure/Recreation: Leisure / Recreation Do You Have  Hobbies?: No  Exercise/Diet: Exercise/Diet Do You Exercise?: No Have You Gained or Lost A Significant Amount of Weight in the Past Six Months?: No Do You Follow a Special Diet?: No Do You Have Any Trouble Sleeping?: Yes Explanation of Sleeping Difficulties: Patient reports that he falls asleep at midnight "I only sleep 4-5 hours then I'm up all day with no motivation"   CCA Employment/Education Employment/Work Situation: Employment / Work Copywriter, 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, government has Been Impacted by Current Illness: No Has Patient ever Been in Passenger transport manager?: No  Education: Education Is Patient Currently Attending School?: No Did You Have An Individualized Education Program (IIEP): No Did You Have Any Difficulty At [employer]?: No Patient's Education Has Been Impacted by Current Illness: No   CCA Family/Childhood History Family and Relationship History: Family history Marital status: Single Does patient have children?: Yes How many children?: 1 How is patient's relationship with their children?: Good relationship with son  Childhood History:  Childhood History Did patient suffer any verbal/emotional/physical/sexual abuse as a child?: No Did patient suffer from severe childhood neglect?: No Has patient ever been sexually abused/assaulted/raped as an adolescent or adult?: No Was the patient ever a victim of a crime or a disaster?: No Witnessed domestic violence?: No Has patient been affected by domestic violence as an adult?: No  Child/Adolescent Assessment:     CCA Substance Use Alcohol/Drug Use: Alcohol / Drug Use Pain Medications: See MAR Prescriptions: See MAR Over the Counter: See MAR History of alcohol / drug use?: No history of alcohol / drug abuse                         ASAM's:  Six Dimensions of Multidimensional Assessment  Dimension 1:  Acute Intoxication and/or Withdrawal Potential:      Dimension 2:  Biomedical Conditions and  Complications:      Dimension 3:  Emotional, Behavioral, or Cognitive Conditions and Complications:     Dimension 4:  Readiness to Change:     Dimension 5:  Relapse, Continued use, or Continued Problem Potential:     Dimension 6:  Recovery/Living Environment:     ASAM Severity Score:    ASAM Recommended Level of Treatment:     Substance use Disorder (SUD)    Recommendations for Services/Supports/Treatments:    DSM5 Diagnoses: Patient Active Problem List   Diagnosis Date Noted   Hypertension 01/15/2021    Patient Centered  Plan: Patient is on the following Treatment Plan(s):  Anxiety and Depression   Referrals to Alternative Service(s): Referred to Alternative Service(s):   Place:   Date:   Time:    Referred to Alternative Service(s):   Place:   Date:   Time:    Referred to Alternative Service(s):   Place:   Date:   Time:    Referred to Alternative Service(s):   Place:   Date:   Time:     Kilyn Maragh A Shirley Bolle, LCAS-A

## 2021-04-05 NOTE — ED Triage Notes (Signed)
Pt comes into the ED via POV with his son for voluntary psychiatric evaluation.  Pt states he was recently released from psychiatric inpatient at [facility] where they adjusted some of his medications including Seroquel, Trazadone, etc.  Pt states he was having problems with sleeping initially and that's why he went to [facility].  He is now sleeping, but feels sick from all the medications and needs and adjustment with his psych meds.  Pt does not want to be sent home.

## 2021-04-05 NOTE — ED Notes (Signed)
TTS here for pt.

## 2021-04-05 NOTE — ED Provider Notes (Signed)
[facility] Emergency Department Provider Note  ____________________________________________  Time seen: Approximately 11:54 PM  I have reviewed the triage vital signs and the nursing notes.   HISTORY  Chief Complaint Psychiatric Evaluation    HPI Manuel Cummings is a 75 y.o. male with a history of anxiety depression and hypertension who comes ED complaining of depressed mood, requesting to be admitted to the hospital.  He lives alone.  He is currently being treated by his PCP for depression and insomnia, was started on Seroquel and trazodone.  He has been able to start sleeping 5 or 6 hours a night according to their notes which I reviewed, but now is having more depression symptoms.  His PCP in conjunction with psychiatry feels that the symptoms are being unmasked by his ability to increase his sleep, and they are continuing to explore medication management on the outpatient setting.  Patient denies SI HI or hallucinations.  He is eating and drinking.  He is ambulatory.    Past Medical History:  Diagnosis Date   Anxiety    Hypertension      Patient Active Problem List   Diagnosis Date Noted   Hypertension 01/15/2021     History reviewed. No pertinent surgical history.   Prior to Admission medications   Medication Sig Start Date End Date Taking? Authorizing Provider  melatonin 3 MG TABS tablet Take 1 tablet (3 mg total) by mouth at bedtime. 04/05/21 05/05/21 Yes Carrie Mew, MD  amitriptyline (ELAVIL) 100 MG tablet Take 300 mg by mouth at bedtime.    [provider]  amLODipine (NORVASC) 2.5 MG tablet Take by mouth. 01/10/21 01/10/22  [provider]  aspirin 81 MG tablet Take 81 mg by mouth daily.    [provider]  busPIRone (BUSPAR) 5 MG tablet Take 5 mg by mouth 2 (two) times daily. 01/16/21   [provider]  doxycycline (VIBRA-TABS) 100 MG tablet Take 100 mg by mouth 2 (two) times daily. 01/17/21    [provider]  hydrOXYzine (ATARAX/VISTARIL) 25 MG tablet Take 1 tablet (25 mg total) by mouth every 8 (eight) hours as needed. 01/20/21   Margarette Canada, NP  rosuvastatin (CRESTOR) 20 MG tablet Take 20 mg by mouth daily. 01/02/21   [provider]  tamsulosin (FLOMAX) 0.4 MG CAPS capsule Take by mouth. 01/16/21 01/16/22  [provider]     Allergies Codeine and Penicillins   History reviewed. No pertinent family history.  Social History Social History   Tobacco Use   Smoking status: Former    Packs/day: 1.00    Years: 2.00    Pack years: 2.00    Types: Cigarettes    Quit date: 04/28/1962    Years since quitting: 58.9   Smokeless tobacco: Never  Vaping Use   Vaping Use: Never used  Substance Use Topics   Alcohol use: No   Drug use: No    Review of Systems  Constitutional:   No fever or chills.  ENT:   No sore throat. No rhinorrhea. Cardiovascular:   No chest pain or syncope. Respiratory:   No dyspnea or cough. Gastrointestinal:   Negative for abdominal pain, vomiting and diarrhea.  Musculoskeletal:   Negative for focal pain or swelling All other systems reviewed and are negative except as documented above in ROS and HPI.  ____________________________________________   PHYSICAL EXAM:  VITAL SIGNS: ED Triage Vitals  Enc Vitals Group     BP 04/05/21 1845 (!) 173/73  Pulse Rate 04/05/21 1845 78     Resp 04/05/21 1845 16     Temp 04/05/21 1845 97.8 F (36.6 C)     Temp Source 04/05/21 1845 Oral     SpO2 04/05/21 1845 100 %     Weight 04/05/21 1828 184 lb 15.5 oz (83.9 kg)     Height 04/05/21 1828 6\' 2"  (1.88 m)     Head Circumference --      Peak Flow --      Pain Score 04/05/21 1828 0     Pain Loc --      Pain Edu? --      Excl. in [place]? --     Vital signs reviewed, nursing assessments reviewed.   Constitutional:   Alert and oriented. Non-toxic appearance. Eyes:   Conjunctivae are normal. EOMI. ENT      Head:   Normocephalic  and atraumatic.      Mouth/Throat:   MMM      Neck:   No meningismus. Full ROM. Hematological/Lymphatic/Immunilogical:   No cervical lymphadenopathy. Cardiovascular:   RRR. Symmetric bilateral radial and DP pulses.  No murmurs. Cap refill less than 2 seconds. Respiratory:   Normal respiratory effort without tachypnea/retractions. Breath sounds are clear and equal bilaterally. No wheezes/rales/rhonchi. Gastrointestinal:   Soft and nontender. Non distended. There is no CVA tenderness.  No rebound, rigidity, or guarding. Musculoskeletal:   Normal range of motion in all extremities.  No edema. Neurologic:   Normal speech and language.  Motor grossly intact. No acute focal neurologic deficits are appreciated.  Skin:    Skin is warm, dry and intact. No rash noted.  No wounds.  ____________________________________________    LABS (pertinent positives/negatives) (all labs ordered are listed, but only abnormal results are displayed) Labs Reviewed  COMPREHENSIVE METABOLIC PANEL - Abnormal; Notable for the following components:      Result Value   Sodium 132 (*)    Glucose, Bld 111 (*)    BUN 31 (*)    Creatinine, Ser 1.75 (*)    Total Protein 6.4 (*)    GFR, Estimated 40 (*)    All other components within normal limits  SALICYLATE LEVEL - Abnormal; Notable for the following components:   Salicylate Lvl <7.2 (*)    All other components within normal limits  ACETAMINOPHEN LEVEL - Abnormal; Notable for the following components:   Acetaminophen (Tylenol), Serum <10 (*)    All other components within normal limits  CBC - Abnormal; Notable for the following components:   RBC 3.33 (*)    Hemoglobin 10.3 (*)    HCT 31.5 (*)    RDW 17.4 (*)    All other components within normal limits  URINE DRUG SCREEN, QUALITATIVE ([facility] ONLY) - Abnormal; Notable for the following components:   Benzodiazepine, Ur Scrn POSITIVE (*)    All other components within normal limits  ETHANOL    ____________________________________________   EKG  ____________________________________________    RADIOLOGY  No results found.  ____________________________________________   PROCEDURES Procedures  ____________________________________________  CLINICAL IMPRESSION / ASSESSMENT AND PLAN / ED COURSE  Pertinent labs & imaging results that were available during my care of the patient were reviewed by me and considered in my medical decision making (see chart for details).  Manuel Cummings was evaluated in Emergency Department on 04/05/2021 for the symptoms described in the history of present illness. He was evaluated in the context of the global COVID-19 pandemic, which necessitated consideration that the patient might  be at risk for infection with the SARS-CoV-2 virus that causes COVID-19. Institutional protocols and algorithms that pertain to the evaluation of patients at risk for COVID-19 are in a state of rapid change based on information released by regulatory bodies including the CDC and federal and state organizations. These policies and algorithms were followed during the patient's care in the ED.   Patient presents with depressed mood, feeling restless, requesting hospitalization.  Discussed with TTS who evaluated the patient and reviewed the patient's case with [facility], its not felt that he requires or would benefit from inpatient care at this time.  No medication changes right now.  I will offer melatonin in addition to his trazodone and Seroquel, stable for discharge.      ____________________________________________   FINAL CLINICAL IMPRESSION(S) / ED DIAGNOSES    Final diagnoses:  Depression, unspecified depression type     ED Discharge Orders          Ordered    melatonin 3 MG TABS tablet  Daily at bedtime        04/05/21 2353            Portions of this note were generated with dragon dictation software. Dictation errors may occur  despite best attempts at proofreading.   Carrie Mew, MD 04/05/21 2356

## 2021-04-06 NOTE — ED Notes (Signed)
Pt's son is here.

## 2021-04-06 NOTE — ED Notes (Signed)
Waiting on pt's son to pick pt up, per thad, pt's son he is on his way and approx 45 min away. Discharged instructions and prescription reviewed with pt.

## 2021-04-06 NOTE — ED Notes (Addendum)
Dr. Joni Fears speaking with pt at great length regarding home medications and discharge instructions after pt elicited more concern regarding discharge instructions.

## 2021-04-06 NOTE — ED Notes (Signed)
Pt's son is here, pt dishcarged with son. Paperwork given to pt's son.

## 2021-04-12 ENCOUNTER — Other Ambulatory Visit: Payer: Self-pay

## 2021-04-12 ENCOUNTER — Ambulatory Visit
Admission: EM | Admit: 2021-04-12 | Discharge: 2021-04-12 | Disposition: A | Discharge status: Home / Self Care | Payer: Medicare Other | Attending: Medical Oncology | Admitting: Medical Oncology

## 2021-04-12 DIAGNOSIS — Z111 Encounter for screening for respiratory tuberculosis: Secondary | ICD-10-CM

## 2021-04-12 MED ORDER — TUBERCULIN PPD 5 UNIT/0.1ML ID SOLN
5.0000 [IU] | Freq: Once | INTRADERMAL | Status: DC
  Administered 2021-04-12: 5 [IU] via INTRADERMAL

## 2021-04-12 NOTE — ED Triage Notes (Signed)
Patient presents to Urgent Care for TB skin test. He states he needs it for [facility], assisted living facility.

## 2021-04-14 ENCOUNTER — Ambulatory Visit
Admission: EM | Admit: 2021-04-14 | Discharge: 2021-04-14 | Disposition: A | Discharge status: Home / Self Care | Payer: Medicare Other | Attending: Emergency Medicine | Admitting: Emergency Medicine

## 2021-04-14 ENCOUNTER — Other Ambulatory Visit: Payer: Self-pay

## 2021-04-14 DIAGNOSIS — R7611 Nonspecific reaction to tuberculin skin test without active tuberculosis: Secondary | ICD-10-CM

## 2021-04-14 DIAGNOSIS — Z111 Encounter for screening for respiratory tuberculosis: Secondary | ICD-10-CM

## 2021-04-14 NOTE — ED Triage Notes (Signed)
PPD Reading on LFA at 1001 on 04/14/21 was 47mm Negative.

## 2021-05-21 ENCOUNTER — Other Ambulatory Visit: Payer: Self-pay

## 2021-05-21 ENCOUNTER — Emergency Department: Payer: Medicare Other

## 2021-05-21 ENCOUNTER — Inpatient Hospital Stay
Admission: EM | Admit: 2021-05-21 | Discharge: 2021-05-24 | DRG: 683 | Disposition: A | Discharge status: Home Health | Payer: Medicare Other | Attending: Internal Medicine | Admitting: Internal Medicine

## 2021-05-21 ENCOUNTER — Inpatient Hospital Stay: Payer: Medicare Other

## 2021-05-21 DIAGNOSIS — E871 Hypo-osmolality and hyponatremia: Secondary | ICD-10-CM | POA: Diagnosis present

## 2021-05-21 DIAGNOSIS — F419 Anxiety disorder, unspecified: Secondary | ICD-10-CM | POA: Diagnosis present

## 2021-05-21 DIAGNOSIS — Z880 Allergy status to penicillin: Secondary | ICD-10-CM | POA: Diagnosis not present

## 2021-05-21 DIAGNOSIS — G4700 Insomnia, unspecified: Secondary | ICD-10-CM | POA: Diagnosis present

## 2021-05-21 DIAGNOSIS — E860 Dehydration: Secondary | ICD-10-CM | POA: Diagnosis present

## 2021-05-21 DIAGNOSIS — E875 Hyperkalemia: Secondary | ICD-10-CM | POA: Diagnosis not present

## 2021-05-21 DIAGNOSIS — Z9181 History of falling: Secondary | ICD-10-CM

## 2021-05-21 DIAGNOSIS — F32A Depression, unspecified: Secondary | ICD-10-CM | POA: Diagnosis present

## 2021-05-21 DIAGNOSIS — Z885 Allergy status to narcotic agent status: Secondary | ICD-10-CM

## 2021-05-21 DIAGNOSIS — Z7982 Long term (current) use of aspirin: Secondary | ICD-10-CM

## 2021-05-21 DIAGNOSIS — Z79899 Other long term (current) drug therapy: Secondary | ICD-10-CM | POA: Diagnosis not present

## 2021-05-21 DIAGNOSIS — N1832 Chronic kidney disease, stage 3b: Secondary | ICD-10-CM | POA: Diagnosis present

## 2021-05-21 DIAGNOSIS — Z20822 Contact with and (suspected) exposure to covid-19: Secondary | ICD-10-CM | POA: Diagnosis present

## 2021-05-21 DIAGNOSIS — Z66 Do not resuscitate: Secondary | ICD-10-CM | POA: Diagnosis present

## 2021-05-21 DIAGNOSIS — W19XXXA Unspecified fall, initial encounter: Secondary | ICD-10-CM | POA: Diagnosis present

## 2021-05-21 DIAGNOSIS — C189 Malignant neoplasm of colon, unspecified: Secondary | ICD-10-CM | POA: Diagnosis present

## 2021-05-21 DIAGNOSIS — Z87891 Personal history of nicotine dependence: Secondary | ICD-10-CM

## 2021-05-21 DIAGNOSIS — E861 Hypovolemia: Secondary | ICD-10-CM | POA: Diagnosis present

## 2021-05-21 DIAGNOSIS — I129 Hypertensive chronic kidney disease with stage 1 through stage 4 chronic kidney disease, or unspecified chronic kidney disease: Secondary | ICD-10-CM | POA: Diagnosis present

## 2021-05-21 DIAGNOSIS — I951 Orthostatic hypotension: Secondary | ICD-10-CM | POA: Diagnosis present

## 2021-05-21 DIAGNOSIS — R296 Repeated falls: Secondary | ICD-10-CM | POA: Diagnosis present

## 2021-05-21 DIAGNOSIS — N179 Acute kidney failure, unspecified: Principal | ICD-10-CM | POA: Diagnosis present

## 2021-05-21 DIAGNOSIS — F418 Other specified anxiety disorders: Secondary | ICD-10-CM | POA: Diagnosis present

## 2021-05-21 DIAGNOSIS — N183 Chronic kidney disease, stage 3 unspecified: Secondary | ICD-10-CM | POA: Diagnosis present

## 2021-05-21 DIAGNOSIS — R195 Other fecal abnormalities: Secondary | ICD-10-CM | POA: Diagnosis present

## 2021-05-21 DIAGNOSIS — E872 Acidosis, unspecified: Secondary | ICD-10-CM | POA: Diagnosis present

## 2021-05-21 DIAGNOSIS — R5381 Other malaise: Secondary | ICD-10-CM | POA: Diagnosis present

## 2021-05-21 LAB — CBC WITH DIFFERENTIAL/PLATELET
Abs Immature Granulocytes: 0.03 10*3/uL (ref 0.00–0.07)
Basophils Absolute: 0 10*3/uL (ref 0.0–0.1)
Basophils Relative: 0 %
Eosinophils Absolute: 0 10*3/uL (ref 0.0–0.5)
Eosinophils Relative: 0 %
HCT: 30.5 % — ABNORMAL LOW (ref 39.0–52.0)
Hemoglobin: 9.9 g/dL — ABNORMAL LOW (ref 13.0–17.0)
Immature Granulocytes: 0 %
Lymphocytes Relative: 8 %
Lymphs Abs: 0.6 10*3/uL — ABNORMAL LOW (ref 0.7–4.0)
MCH: 30.6 pg (ref 26.0–34.0)
MCHC: 32.5 g/dL (ref 30.0–36.0)
MCV: 94.1 fL (ref 80.0–100.0)
Monocytes Absolute: 0.6 10*3/uL (ref 0.1–1.0)
Monocytes Relative: 8 %
Neutro Abs: 5.9 10*3/uL (ref 1.7–7.7)
Neutrophils Relative %: 84 %
Platelets: 516 10*3/uL — ABNORMAL HIGH (ref 150–400)
RBC: 3.24 MIL/uL — ABNORMAL LOW (ref 4.22–5.81)
RDW: 13.6 % (ref 11.5–15.5)
WBC: 7.1 10*3/uL (ref 4.0–10.5)
nRBC: 0 % (ref 0.0–0.2)

## 2021-05-21 LAB — COMPREHENSIVE METABOLIC PANEL
ALT: 14 U/L (ref 0–44)
AST: 21 U/L (ref 15–41)
Albumin: 3.6 g/dL (ref 3.5–5.0)
Alkaline Phosphatase: 64 U/L (ref 38–126)
Anion gap: 13 (ref 5–15)
BUN: 132 mg/dL — ABNORMAL HIGH (ref 8–23)
CO2: 17 mmol/L — ABNORMAL LOW (ref 22–32)
Calcium: 8.6 mg/dL — ABNORMAL LOW (ref 8.9–10.3)
Chloride: 98 mmol/L (ref 98–111)
Creatinine, Ser: 4.36 mg/dL — ABNORMAL HIGH (ref 0.61–1.24)
GFR, Estimated: 13 mL/min — ABNORMAL LOW (ref >60)
Glucose, Bld: 139 mg/dL — ABNORMAL HIGH (ref 70–99)
Potassium: 7.3 mmol/L (ref 3.5–5.1)
Sodium: 128 mmol/L — ABNORMAL LOW (ref 135–145)
Total Bilirubin: 0.5 mg/dL (ref 0.3–1.2)
Total Protein: 7 g/dL (ref 6.5–8.1)

## 2021-05-21 LAB — BASIC METABOLIC PANEL
Anion gap: 12 (ref 5–15)
BUN: 115 mg/dL — ABNORMAL HIGH (ref 8–23)
CO2: 20 mmol/L — ABNORMAL LOW (ref 22–32)
Calcium: 8 mg/dL — ABNORMAL LOW (ref 8.9–10.3)
Chloride: 98 mmol/L (ref 98–111)
Creatinine, Ser: 3.84 mg/dL — ABNORMAL HIGH (ref 0.61–1.24)
GFR, Estimated: 15 mL/min — ABNORMAL LOW (ref >60)
Glucose, Bld: 123 mg/dL — ABNORMAL HIGH (ref 70–99)
Potassium: 5.5 mmol/L — ABNORMAL HIGH (ref 3.5–5.1)
Sodium: 130 mmol/L — ABNORMAL LOW (ref 135–145)

## 2021-05-21 LAB — URINALYSIS, COMPLETE (UACMP) WITH MICROSCOPIC
Bilirubin Urine: NEGATIVE
Glucose, UA: NEGATIVE mg/dL
Hgb urine dipstick: NEGATIVE
Ketones, ur: NEGATIVE mg/dL
Leukocytes,Ua: NEGATIVE
Nitrite: NEGATIVE
Protein, ur: NEGATIVE mg/dL
Specific Gravity, Urine: 1.015 (ref 1.005–1.030)
pH: 5 (ref 5.0–8.0)

## 2021-05-21 LAB — RESP PANEL BY RT-PCR (FLU A&B, COVID) ARPGX2
Influenza A by PCR: NEGATIVE
Influenza B by PCR: NEGATIVE
SARS Coronavirus 2 by RT PCR: NEGATIVE

## 2021-05-21 LAB — PROTIME-INR
INR: 1.1 (ref 0.8–1.2)
Prothrombin Time: 13.7 seconds (ref 11.4–15.2)

## 2021-05-21 LAB — CK: Total CK: 506 U/L — ABNORMAL HIGH (ref 49–397)

## 2021-05-21 LAB — LACTIC ACID, PLASMA
Lactic Acid, Venous: 1 mmol/L (ref 0.5–1.9)
Lactic Acid, Venous: 1.2 mmol/L (ref 0.5–1.9)

## 2021-05-21 LAB — APTT: aPTT: 29 seconds (ref 24–36)

## 2021-05-21 MED ORDER — TRAMADOL HCL 50 MG PO TABS
50.0000 mg | ORAL_TABLET | Freq: Three times a day (TID) | ORAL | Status: DC | PRN
  Administered 2021-05-21 – 2021-05-23 (×5): 50 mg via ORAL
  Filled 2021-05-21 (×5): qty 1

## 2021-05-21 MED ORDER — FERROUS SULFATE 325 (65 FE) MG PO TABS
325.0000 mg | ORAL_TABLET | Freq: Every morning | ORAL | Status: DC
  Administered 2021-05-22 – 2021-05-24 (×3): 325 mg via ORAL
  Filled 2021-05-21 (×3): qty 1

## 2021-05-21 MED ORDER — TRAZODONE HCL 100 MG PO TABS
100.0000 mg | ORAL_TABLET | Freq: Every day | ORAL | Status: DC
  Administered 2021-05-21 – 2021-05-23 (×3): 100 mg via ORAL
  Filled 2021-05-21 (×3): qty 1

## 2021-05-21 MED ORDER — LORAZEPAM 0.5 MG PO TABS
0.5000 mg | ORAL_TABLET | Freq: Three times a day (TID) | ORAL | Status: DC | PRN
  Administered 2021-05-22 – 2021-05-24 (×5): 0.5 mg via ORAL
  Filled 2021-05-21 (×6): qty 1

## 2021-05-21 MED ORDER — TRIAZOLAM 0.25 MG PO TABS
0.2500 mg | ORAL_TABLET | Freq: Every day | ORAL | Status: DC
  Administered 2021-05-21 – 2021-05-23 (×3): 0.25 mg via ORAL
  Filled 2021-05-21 (×6): qty 1

## 2021-05-21 MED ORDER — ONDANSETRON HCL 4 MG/2ML IJ SOLN: 4.0000 mg | Freq: Four times a day (QID) | INTRAMUSCULAR | Status: DC | PRN

## 2021-05-21 MED ORDER — QUETIAPINE FUMARATE 25 MG PO TABS
200.0000 mg | ORAL_TABLET | Freq: Every day | ORAL | Status: DC
  Administered 2021-05-21 – 2021-05-23 (×3): 200 mg via ORAL
  Filled 2021-05-21 (×2): qty 8
  Filled 2021-05-21: qty 1

## 2021-05-21 MED ORDER — SODIUM BICARBONATE 8.4 % IV SOLN
50.0000 meq | Freq: Once | INTRAVENOUS | Status: AC
  Administered 2021-05-21: 12:00:00 50 meq via INTRAVENOUS
  Filled 2021-05-21: qty 50

## 2021-05-21 MED ORDER — LACTATED RINGERS IV BOLUS
500.0000 mL | Freq: Once | INTRAVENOUS | Status: AC
Start: 2021-05-21 — End: 2021-05-22
  Administered 2021-05-21: 500 mL via INTRAVENOUS

## 2021-05-21 MED ORDER — ROSUVASTATIN CALCIUM 20 MG PO TABS: 20.0000 mg | ORAL_TABLET | Freq: Every day | ORAL | Status: DC

## 2021-05-21 MED ORDER — CALCIUM GLUCONATE-NACL 1-0.675 GM/50ML-% IV SOLN
1.0000 g | Freq: Once | INTRAVENOUS | Status: AC
Start: 2021-05-21 — End: 2021-05-21
  Administered 2021-05-21: 12:00:00 1000 mg via INTRAVENOUS
  Filled 2021-05-21: qty 50

## 2021-05-21 MED ORDER — INSULIN ASPART 100 UNIT/ML IJ SOLN
5.0000 [IU] | Freq: Once | INTRAMUSCULAR | Status: AC
  Administered 2021-05-21: 12:00:00 5 [IU] via INTRAVENOUS
  Filled 2021-05-21: qty 1

## 2021-05-21 MED ORDER — VITAMIN D 25 MCG (1000 UNIT) PO TABS
5000.0000 [IU] | ORAL_TABLET | Freq: Every day | ORAL | Status: DC
  Administered 2021-05-21 – 2021-05-24 (×4): 5000 [IU] via ORAL
  Filled 2021-05-21 (×4): qty 5

## 2021-05-21 MED ORDER — ONDANSETRON HCL 4 MG PO TABS: 4.0000 mg | ORAL_TABLET | Freq: Four times a day (QID) | ORAL | Status: DC | PRN

## 2021-05-21 MED ORDER — SODIUM CHLORIDE 0.9 % IV SOLN: Freq: Once | INTRAVENOUS | Status: AC

## 2021-05-21 MED ORDER — PANTOPRAZOLE SODIUM 40 MG IV SOLR: 40.0000 mg | Freq: Two times a day (BID) | INTRAVENOUS | Status: DC

## 2021-05-21 MED ORDER — PANTOPRAZOLE INFUSION (NEW) - SIMPLE MED
8.0000 mg/h | INTRAVENOUS | Status: DC
  Administered 2021-05-21 – 2021-05-23 (×6): 8 mg/h via INTRAVENOUS
  Filled 2021-05-21 (×7): qty 100

## 2021-05-21 MED ORDER — FENTANYL CITRATE PF 50 MCG/ML IJ SOSY: 50.0000 ug | PREFILLED_SYRINGE | INTRAMUSCULAR | Status: DC | PRN

## 2021-05-21 MED ORDER — SODIUM CHLORIDE 0.9 % IV BOLUS
1000.0000 mL | Freq: Once | INTRAVENOUS | Status: AC
  Administered 2021-05-21: 12:00:00 1000 mL via INTRAVENOUS

## 2021-05-21 MED ORDER — SODIUM ZIRCONIUM CYCLOSILICATE 10 G PO PACK
10.0000 g | PACK | Freq: Every day | ORAL | Status: DC
  Administered 2021-05-21 – 2021-05-22 (×2): 10 g via ORAL
  Filled 2021-05-21 (×2): qty 1

## 2021-05-21 MED ORDER — ASCORBIC ACID 500 MG PO TABS
500.0000 mg | ORAL_TABLET | Freq: Every day | ORAL | Status: DC
  Administered 2021-05-21 – 2021-05-24 (×4): 500 mg via ORAL
  Filled 2021-05-21 (×4): qty 1

## 2021-05-21 MED ORDER — TAMSULOSIN HCL 0.4 MG PO CAPS
0.4000 mg | ORAL_CAPSULE | Freq: Every day | ORAL | Status: DC
  Administered 2021-05-21 – 2021-05-24 (×4): 0.4 mg via ORAL
  Filled 2021-05-21 (×4): qty 1

## 2021-05-21 MED ORDER — MELATONIN 5 MG PO TABS
2.5000 mg | ORAL_TABLET | Freq: Every day | ORAL | Status: DC
  Administered 2021-05-21 – 2021-05-23 (×3): 2.5 mg via ORAL
  Filled 2021-05-21 (×3): qty 1

## 2021-05-21 MED ORDER — STERILE WATER FOR INJECTION IV SOLN
INTRAVENOUS | Status: DC
  Filled 2021-05-21 (×3): qty 1000
  Filled 2021-05-21 (×2): qty 150
  Filled 2021-05-21: qty 1000

## 2021-05-21 MED ORDER — LACTATED RINGERS IV BOLUS
1000.0000 mL | Freq: Once | INTRAVENOUS | Status: AC
  Administered 2021-05-21: 11:00:00 1000 mL via INTRAVENOUS

## 2021-05-21 MED ORDER — PANTOPRAZOLE 80MG IVPB - SIMPLE MED
80.0000 mg | Freq: Once | INTRAVENOUS | Status: AC
  Administered 2021-05-21: 80 mg via INTRAVENOUS
  Filled 2021-05-21: qty 80

## 2021-05-21 NOTE — ED Notes (Signed)
Dietary called to get pt a lunch tray

## 2021-05-21 NOTE — ED Triage Notes (Addendum)
Pt arrived by EMS from home, pt lives alone Pt has had multiple falls and increased weakness of the past few days. Pt state he has fallen about 6 times over past 3 days, state he losses balance and gets weak. Denies feeling lightheaded or dizzy   Pt has low BP during transport systolic 95Z and 96D  Alert and oriented. CBG 174

## 2021-05-21 NOTE — Progress Notes (Signed)
[facility]  ROUNDING NOTE   Subjective:   Manuel Cummings is a 76 y.o. male with past medical history of anxiety, hypertension, and chronic kidney disease stage IIIb.  Patient presents to the emergency department with complaints of frequent falls and weakness.  Patient has been admitted for Acute kidney injury [facility]) [N17.9]   Patient is known to our practice and is followed outpatient by Dr. Holley Raring.  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  He was placed in a rehab after surgery and has been discharged from rehab for 1 week.  During this week he has sustained multiple falls, complaining of weakness and dizziness.  Does report poor oral intake but drinks ensures.  Complains of insomnia despite multiple sedate of medications ordered at night.  Denies nausea, vomiting, and diarrhea.  Denies shortness of breath and cough.  Denies pain and discomfort.  Labs on arrival include sodium 128, potassium 7.4, bicarb 17, glucose 139, BUN 132, creatinine 4.36 with GFR 13.  Baseline appears to be creatinine 1.75 with GFR 40 on 04/05/2021.  Imaging negative for acute changes.  We have been consulted to evaluate acute kidney injury during this admission Objective:  Vital signs in last 24 hours:  Temp:  [97.5 F (36.4 C)] 97.5 F (36.4 C) (01/24 1020) Pulse Rate:  [72-85] 85 (01/24 1400) Resp:  [12-21] 15 (01/24 1400) BP: (78-127)/(50-81) 127/50 (01/24 1400) SpO2:  [91 %-100 %] 100 % (01/24 1400) Weight:  [72.6 kg] 72.6 kg (01/24 1024)  Weight change:  Filed Weights   05/21/21 1024  Weight: 72.6 kg    Intake/Output: No intake/output data recorded.   Intake/Output this shift:  Total I/O In: 1250 [IV Piggyback:1250] Out: -   Physical Exam: General: NAD, resting in bed, anxious  Head:  Normocephalic, atraumatic. Dry oral mucosal membranes  Eyes: Anicteric  Lungs:  Clear to auscultation, normal effort  Heart: Regular rate and rhythm  Abdomen:  Soft, nontender, nondistended  Extremities: No peripheral edema.  Neurologic: Nonfocal, moving all four extremities  Skin: Abrasions on nose, poor skin turgor  Access: None    Basic Metabolic Panel: Recent Labs  Lab 05/21/21 1021  NA 128*  K 7.3*  CL 98  CO2 17*  GLUCOSE 139*  BUN 132*  CREATININE 4.36*  CALCIUM 8.6*    Liver Function Tests: Recent Labs  Lab 05/21/21 1021  AST 21  ALT 14  ALKPHOS 64  BILITOT 0.5  PROT 7.0  ALBUMIN 3.6   No results for input(s): LIPASE, AMYLASE in the last 168 hours. No results for input(s): AMMONIA in the last 168 hours.  CBC: Recent Labs  Lab 05/21/21 1021  WBC 7.1  NEUTROABS 5.9  HGB 9.9*  HCT 30.5*  MCV 94.1  PLT 516*    Cardiac Enzymes: No results for input(s): CKTOTAL, CKMB, CKMBINDEX, TROPONINI in the last 168 hours.  BNP: Invalid input(s): POCBNP  CBG: No results for input(s): GLUCAP in the last 168 hours.  Microbiology: Results for orders placed or performed during the hospital encounter of 05/21/21  Resp Panel by RT-PCR (Flu A&B, Covid) Nasopharyngeal Swab     Status: None   Collection Time: 05/21/21 12:48 PM   Specimen: Nasopharyngeal Swab; Nasopharyngeal(NP) swabs in vial transport medium  Result Value Ref Range Status   SARS Coronavirus 2 by RT PCR  NEGATIVE NEGATIVE Final    Comment: (NOTE) SARS-CoV-2 target nucleic acids are NOT DETECTED.  The SARS-CoV-2 RNA is generally detectable in upper respiratory specimens during the acute phase of infection. The lowest concentration of SARS-CoV-2 viral copies this assay can detect is 138 copies/mL. A negative result does not preclude SARS-Cov-2 infection and should not be used as the sole basis for treatment or other patient management decisions. A negative result may occur with  improper specimen  collection/handling, submission of specimen other than nasopharyngeal swab, presence of viral mutation(s) within the areas targeted by this assay, and inadequate number of viral copies(<138 copies/mL). A negative result must be combined with clinical observations, patient history, and epidemiological information. The expected result is Negative.  Fact Sheet for Patients:  EntrepreneurPulse.com.au  Fact Sheet for Healthcare Providers:  IncredibleEmployment.be  This test is no t yet approved or cleared by the [place] FDA and  has been authorized for detection and/or diagnosis of SARS-CoV-2 by FDA under an Emergency Use Authorization (EUA). This EUA will remain  in effect (meaning this test can be used) for the duration of the COVID-19 declaration under Section 564(b)(1) of the Act, 21 U.S.C.section 360bbb-3(b)(1), unless the authorization is terminated  or revoked sooner.       Influenza A by PCR NEGATIVE NEGATIVE Final   Influenza B by PCR NEGATIVE NEGATIVE Final    Comment: (NOTE) The Xpert Xpress SARS-CoV-2/FLU/RSV plus assay is intended as an aid in the diagnosis of influenza from Nasopharyngeal swab specimens and should not be used as a sole basis for treatment. Nasal washings and aspirates are unacceptable for Xpert Xpress SARS-CoV-2/FLU/RSV testing.  Fact Sheet for Patients: EntrepreneurPulse.com.au  Fact Sheet for Healthcare Providers: IncredibleEmployment.be  This test is not yet approved or cleared by the [place] FDA and has been authorized for detection and/or diagnosis of SARS-CoV-2 by FDA under an Emergency Use Authorization (EUA). This EUA will remain in effect (meaning this test can be used) for the duration of the COVID-19 declaration under Section 564(b)(1) of the Act, 21 U.S.C. section 360bbb-3(b)(1), unless the authorization is terminated or revoked.  Performed at [facility], [address] [place], [place] [address]     Coagulation Studies: Recent Labs    05/21/21 1021  LABPROT 13.7  INR 1.1    Urinalysis: Recent Labs    05/21/21 1044  COLORURINE YELLOW*  LABSPEC 1.015  PHURINE 5.0  GLUCOSEU NEGATIVE  HGBUR NEGATIVE  BILIRUBINUR NEGATIVE  KETONESUR NEGATIVE  PROTEINUR NEGATIVE  NITRITE NEGATIVE  LEUKOCYTESUR NEGATIVE      Imaging: CT HEAD WO CONTRAST (5MM)  Result Date: 05/21/2021 CLINICAL DATA:  Multiple falls.  Weakness.  Loss of balance. EXAM: CT HEAD WITHOUT CONTRAST CT CERVICAL SPINE WITHOUT CONTRAST TECHNIQUE: Multidetector CT imaging of the head and cervical spine was performed following the standard protocol without intravenous contrast. Multiplanar CT image reconstructions of the cervical spine were also generated. RADIATION DOSE REDUCTION: This exam was performed according to the departmental dose-optimization program which includes automated exposure control, adjustment of the mA and/or kV according to patient size and/or use of iterative reconstruction technique. COMPARISON:  None. FINDINGS: CT HEAD FINDINGS Brain: Dilated perivascular spaces below the lentiform nuclei. The brainstem, cerebellum, cerebral peduncles, thalami, basal ganglia, basilar cisterns, and ventricular system appear within normal limits. No intracranial hemorrhage, mass lesion, or acute CVA. Vascular: Unremarkable Skull: Unremarkable Sinuses/Orbits: Mild chronic right maxillary and ethmoid sinusitis. Other: No supplemental non-categorized findings. CT CERVICAL SPINE FINDINGS Alignment: 1.5 mm  degenerative posterior subluxation at C5-6. Skull base and vertebrae: No fracture or acute bony findings. Soft tissues and spinal canal: Incidental gas in the left internal jugular system, possibly from IV introduction, typically this is inconsequential in the absence of a right to left cardiac shown. Bilateral common carotid atherosclerotic calcification. Disc  levels: C2-3: Mild left foraminal stenosis due to facet and uncinate spurring. C3-4: Mild left foraminal stenosis and mild central narrowing of the thecal sac due to facet and uncinate spurring, disc bulge, and a small central disc protrusion. C4-5: Mild left borderline right foraminal stenosis due to facet and uncinate spurring along with a mild disc bulge. C5-6: Moderate to prominent right foraminal stenosis due to facet and uncinate spurring. C6-7: Unremarkable. C7-T1: Unremarkable. Upper chest: Biapical pleuroparenchymal scarring. Other: No supplemental non-categorized findings. IMPRESSION: 1. No acute intracranial findings or acute cervical spine findings. 2. 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 3. Cervical spondylosis and degenerative disc disease leading to moderate to prominent impingement at C5-6 and mild impingement at C2-3, C3-4, and C4-5. 4. Bilateral common carotid atherosclerotic calcification. Electronically Signed   By: Van Clines M.D.   On: 05/21/2021 11:24   CT Cervical Spine Wo Contrast  Result Date: 05/21/2021 CLINICAL DATA:  Multiple falls.  Weakness.  Loss of balance. EXAM: CT HEAD WITHOUT CONTRAST CT CERVICAL SPINE WITHOUT CONTRAST TECHNIQUE: Multidetector CT imaging of the head and cervical spine was performed following the standard protocol without intravenous contrast. Multiplanar CT image reconstructions of the cervical spine were also generated. RADIATION DOSE REDUCTION: This exam was performed according to the departmental dose-optimization program which includes automated exposure control, adjustment of the mA and/or kV according to patient size and/or use of iterative reconstruction technique. COMPARISON:  None. FINDINGS: CT HEAD FINDINGS Brain: Dilated perivascular spaces below the lentiform nuclei. The brainstem, cerebellum, cerebral peduncles, thalami, basal ganglia, basilar cisterns, and ventricular system appear within normal limits. No  intracranial hemorrhage, mass lesion, or acute CVA. Vascular: Unremarkable Skull: Unremarkable Sinuses/Orbits: Mild chronic right maxillary and ethmoid sinusitis. Other: No supplemental non-categorized findings. CT CERVICAL SPINE FINDINGS Alignment: 1.5 mm degenerative posterior subluxation at C5-6. Skull base and vertebrae: No fracture or acute bony findings. Soft tissues and spinal canal: Incidental gas in the left internal jugular system, possibly from IV introduction, typically this is inconsequential in the absence of a right to left cardiac shown. Bilateral common carotid atherosclerotic calcification. Disc levels: C2-3: Mild left foraminal stenosis due to facet and uncinate spurring. C3-4: Mild left foraminal stenosis and mild central narrowing of the thecal sac due to facet and uncinate spurring, disc bulge, and a small central disc protrusion. C4-5: Mild left borderline right foraminal stenosis due to facet and uncinate spurring along with a mild disc bulge. C5-6: Moderate to prominent right foraminal stenosis due to facet and uncinate spurring. C6-7: Unremarkable. C7-T1: Unremarkable. Upper chest: Biapical pleuroparenchymal scarring. Other: No supplemental non-categorized findings. IMPRESSION: 1. No acute intracranial findings or acute cervical spine findings. 2. 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 3. Cervical spondylosis and degenerative disc disease leading to moderate to prominent impingement at C5-6 and mild impingement at C2-3, C3-4, and C4-5. 4. Bilateral common carotid atherosclerotic calcification. Electronically Signed   By: Van Clines M.D.   On: 05/21/2021 11:24   DG Chest Port 1 View  Result Date: 05/21/2021 CLINICAL DATA:  Questionable sepsis, multiple falls and increased weakness over past few days, hypertension EXAM: PORTABLE CHEST 1 VIEW COMPARISON:  Portable exam 1053 hours compared  to 01/15/2021 FINDINGS: Normal heart size, mediastinal contours, and  pulmonary vascularity. Lungs clear. No pulmonary infiltrate, pleural effusion, or pneumothorax. No fractures. IMPRESSION: No acute abnormalities. Electronically Signed   By: Lavonia Dana M.D.   On: 05/21/2021 11:55     Medications:    pantoprazole 8 mg/hr (05/21/21 1258)    sodium bicarbonate (isotonic) infusion in sterile water 125 mL/hr at 05/21/21 1240    ascorbic acid  500 mg Oral Daily   cholecalciferol  5,000 Units Oral Daily   [START ON 05/22/2021] ferrous sulfate  325 mg Oral q AM   melatonin  2.5 mg Oral QHS   [START ON 05/25/2021] pantoprazole  40 mg Intravenous Q12H   sodium zirconium cyclosilicate  10 g Oral Daily   tamsulosin  0.4 mg Oral Daily   triazolam  0.25 mg Oral QHS   fentaNYL (SUBLIMAZE) injection, LORazepam, ondansetron **OR** ondansetron (ZOFRAN) IV, traMADol  Assessment/ Plan:  Mr. Manuel Cummings is a 76 y.o.  male ith past medical history of anxiety, hypertension, and chronic kidney disease stage IIIb.  Patient presents to the emergency department with complaints of frequent falls and weakness.  Patient has been admitted for Acute kidney injury [facility]) [N17.9]   Acute Kidney Injury on chronic kidney disease stage IIIb with baseline creatinine 1.75 and GFR of 40 on 04/05/21.  Acute kidney injury secondary to dehydration, patient appears hypovolemic Renal ultrasound ordered, lisinopril held No acute indication for dialysis at this time but will monitor closely.  Agree with ordered IV fluids to rehydrate. Avoid nephrotoxic agents and therapies.  Lab Results  Component Value Date   CREATININE 4.36 (H) 05/21/2021   CREATININE 1.75 (H) 04/05/2021   CREATININE 2.51 (H) 01/08/2021    Intake/Output Summary (Last 24 hours) at 05/21/2021 1607 Last data filed at 05/21/2021 1255 Gross per 24 hour  Intake 1250 ml  Output --  Net 1250 ml   2.  Hypertension with chronic kidney disease.  Home regimen includes lisinopril, amlodipine, and amitriptyline.  All currently held  at this time.  BP currently 127/50    LOS: 0   1/24/20234:07 PM

## 2021-05-21 NOTE — ED Provider Notes (Signed)
[facility] Provider Note    Event Date/Time   First MD Initiated Contact with Patient 05/21/21 1017     (approximate)   History   Fall and Weakness   HPI  Manuel Cummings is a 76 y.o. male with recent admission to [facility] for small bowel obstruction secondary to cecal mass presents to the ER for evaluation of multiple falls in the past 4 days.  Lives at home alone.  States he feels very weak.  Denies any nausea or vomiting.  Denies any pain.  States that whenever he stands up he feels very weak and dizzy.  Review of outpatient clinic visit on the 20th patient was hypotensive in the 69G systolic.  They discontinued his amlodipine and told him to continue lisinopril.  Patient admits to continued weight loss feeling fatigued weakness.     Physical Exam   Triage Vital Signs: ED Triage Vitals  Enc Vitals Group     BP 05/21/21 1020 90/81     Pulse Rate 05/21/21 1020 78     Resp 05/21/21 1020 12     Temp 05/21/21 1020 (!) 97.5 F (36.4 C)     Temp Source 05/21/21 1020 Oral     SpO2 05/21/21 1020 100 %     Weight 05/21/21 1024 160 lb (72.6 kg)     Height 05/21/21 1024 6\' 2"  (1.88 m)     Head Circumference --      Peak Flow --      Pain Score 05/21/21 1023 2     Pain Loc --      Pain Edu? --      Excl. in [place]? --     Most recent vital signs: Vitals:   05/21/21 1200 05/21/21 1400  BP: (!) 119/52 (!) 127/50  Pulse: 74 85  Resp: 14 15  Temp:    SpO2: 91% 100%     Constitutional: Alert  Eyes: Conjunctivae are normal.  Head: Atraumatic. Nose: No congestion/rhinnorhea. Mouth/Throat: Mucous membranes are moist.   Neck: Painless ROM.  Cardiovascular:   Good peripheral circulation. Respiratory: Normal respiratory effort.  No retractions.  Gastrointestinal: Soft and nontender.  Musculoskeletal:  no deformity Neurologic:  MAE spontaneously. No gross focal neurologic deficits are appreciated.  Skin:  Skin is warm, dry and intact. No rash  noted. Psychiatric: Mood and affect are normal. Speech and behavior are normal.    ED Results / Procedures / Treatments   Labs (all labs ordered are listed, but only abnormal results are displayed) Labs Reviewed  COMPREHENSIVE METABOLIC PANEL - Abnormal; Notable for the following components:      Result Value   Sodium 128 (*)    Potassium 7.3 (*)    CO2 17 (*)    Glucose, Bld 139 (*)    BUN 132 (*)    Creatinine, Ser 4.36 (*)    Calcium 8.6 (*)    GFR, Estimated 13 (*)    All other components within normal limits  CBC WITH DIFFERENTIAL/PLATELET - Abnormal; Notable for the following components:   RBC 3.24 (*)    Hemoglobin 9.9 (*)    HCT 30.5 (*)    Platelets 516 (*)    Lymphs Abs 0.6 (*)    All other components within normal limits  RESP PANEL BY RT-PCR (FLU A&B, COVID) ARPGX2  LACTIC ACID, PLASMA  PROTIME-INR  APTT  URINALYSIS, COMPLETE (UACMP) WITH MICROSCOPIC  LACTIC ACID, PLASMA  OCCULT BLOOD X 1 CARD TO LAB, STOOL  BASIC METABOLIC PANEL  CK     EKG  ED ECG REPORT I, Merlyn Lot, the attending physician, personally viewed and interpreted this ECG.   Date: 05/21/2021  EKG Time: 10:23  Rate: dizziness  Rhythm: sinus  Axis: normal  Intervals:normal intervals  ST&T Change: nonspecific st abn, no stemi    RADIOLOGY Please see ED Course for my review and interpretation.  I personally reviewed all radiographic images ordered to evaluate for the above acute complaints and reviewed radiology reports and findings.  These findings were personally discussed with the patient.  Please see medical record for radiology report.    PROCEDURES:  Critical Care performed: Yes, see critical care procedure note(s)  .Critical Care Performed by: Merlyn Lot, MD Authorized by: Merlyn Lot, MD   Critical care provider statement:    Critical care time (minutes):  34   Critical care was necessary to treat or prevent imminent or life-threatening  deterioration of the following conditions:  Renal failure   Critical care was time spent personally by me on the following activities:  Ordering and performing treatments and interventions, ordering and review of laboratory studies, ordering and review of radiographic studies, pulse oximetry, re-evaluation of patient's condition, review of old charts, obtaining history from patient or surrogate, examination of patient, evaluation of patient's response to treatment, discussions with primary provider, discussions with consultants and development of treatment plan with patient or surrogate   MEDICATIONS ORDERED IN ED: Medications  pantoprozole (PROTONIX) 80 mg /NS 100 mL infusion (8 mg/hr Intravenous New Bag/Given 05/21/21 1258)  pantoprazole (PROTONIX) injection 40 mg (has no administration in time range)  sodium zirconium cyclosilicate (LOKELMA) packet 10 g (10 g Oral Given 05/21/21 1242)  sodium bicarbonate 150 mEq in sterile water 1,150 mL infusion ( Intravenous New Bag/Given 05/21/21 1240)  tamsulosin (FLOMAX) capsule 0.4 mg (0.4 mg Oral Given 05/21/21 1244)  traMADol (ULTRAM) tablet 50 mg (50 mg Oral Given 05/21/21 1244)  ondansetron (ZOFRAN) tablet 4 mg (has no administration in time range)    Or  ondansetron (ZOFRAN) injection 4 mg (has no administration in time range)  fentaNYL (SUBLIMAZE) injection 50 mcg (has no administration in time range)  triazolam (HALCION) tablet 0.25 mg (has no administration in time range)  ferrous sulfate tablet 325 mg (has no administration in time range)  melatonin tablet 2.5 mg (has no administration in time range)  ascorbic acid (VITAMIN C) tablet 500 mg (500 mg Oral Given 05/21/21 1339)  cholecalciferol (VITAMIN D3) tablet 5,000 Units (5,000 Units Oral Given 05/21/21 1339)  lactated ringers bolus 1,000 mL (0 mLs Intravenous Stopped 05/21/21 1130)  calcium gluconate 1 g/ 50 mL sodium chloride IVPB (0 mg Intravenous Stopped 05/21/21 1230)  sodium bicarbonate  injection 50 mEq (50 mEq Intravenous Given 05/21/21 1154)  insulin aspart (novoLOG) injection 5 Units (5 Units Intravenous Given 05/21/21 1154)  0.9 %  sodium chloride infusion (0 mLs Intravenous Stopped 05/21/21 1240)  pantoprazole (PROTONIX) 80 mg /NS 100 mL IVPB (80 mg Intravenous New Bag/Given 05/21/21 1239)  sodium chloride 0.9 % bolus 1,000 mL (0 mLs Intravenous Stopped 05/21/21 1255)     IMPRESSION / MDM / [place] / ED COURSE  I reviewed the triage vital signs and the nursing notes.                              Differential diagnosis includes, but is not limited to, Dehydration, sepsis, pna, uti, hypoglycemia, cva,  drug effect, withdrawal, encephalitis  Patient presenting with symptoms as described above.  Does appear dehydrated and frail.  Blood work sent with above differential order IV fluids.  Clinical Course as of 05/21/21 1533  Tue May 21, 2021  1102 CT head by my review does not show evidence of subdural hematoma will await formal radiology report. [PR]  1126 Patient's lactate is normal.  Laboratory called with potassium of seven.  Will give IV calcium as he does have some hyperacute T waves. [PR]  9562 Have also ordered IV sodium bicarb as patient also has hyponatremia and metabolic acidosis. [PR]  1151 I have consulted nephrology.  I will consult hospitalist for admission. [PR]  1201 Patient's stool is guaiac positive.  Ostomy appears well perfused and nonischemic.  Review of his records did have adenocarcinoma on pathology.  His abdominal exam is benign.  Per review of chart it does not appear that he had seen medical oncology yet or been started on chemotherapy.  He is not on any blood thinners. [PR]  1216 Case discussed in consultation with hospitalist, accepts patient to their service. [PR]    Clinical Course User Index [PR] Merlyn Lot, MD     FINAL CLINICAL IMPRESSION(S) / ED DIAGNOSES   Final diagnoses:  Hyperkalemia  AKI (acute kidney injury)  ([place])     Rx / DC Orders   ED Discharge Orders     None        Note:  This document was prepared using Dragon voice recognition software and may include unintentional dictation errors.    Merlyn Lot, MD 05/21/21 [phone]

## 2021-05-21 NOTE — ED Notes (Addendum)
S 

## 2021-05-21 NOTE — ED Notes (Signed)
Critical K+ of 7.3 reported to EDP

## 2021-05-21 NOTE — ED Notes (Signed)
soft BP noted for this pt after getting pain medication and meds for sleeping . Pt instructed not to get up as may have cause for recurrent falls. Pt verbalizes chronically taking this meds. Pt advised to call if need to use urinal.

## 2021-05-21 NOTE — ED Notes (Signed)
Ostomy bad emptied and pt provided a urinal

## 2021-05-21 NOTE — H&P (Addendum)
History and Physical    Manuel Cummings:[id number] DOB: December 07, 1945 DOA: 05/21/2021  PCP: [facility]   Patient coming from: Home  I have personally briefly reviewed patient's old medical records in [place]  Chief Complaint: Frequent falls                               Weakness  HPI: Manuel Cummings is a 76 y.o. male with medical history significant for hypertension, anxiety disorder, insomnia, status post recent right hemicolectomy with end ileostomy on 05/01/21 for an ulcerated partially obstructing mass in the ascending colon with pathology that shows invasive moderately differentiated colonic adenocarcinoma (patient has been referred to medical oncology for consideration of chemotherapy). Patient states that following his surgery he went to subacute rehab and was discharged home 1 week later with plans for home health.  He has been home for about a week but over the last several days has had multiple falls.  He complains of feeling very weak but denies being dizzy or lightheaded.  He denies any loss of consciousness.  Oral intake has been very poor but he states that he tries to drink his ensures.  He also complains of inability to sleep at night and has tried several medications which include trazodone, lorazepam and Seroquel without any significant improvement. He denies having any nausea, no vomiting, no fever, no chills, no abdominal pain, no leg swelling, no headache, no cough, no urinary frequency, no nocturia or hematuria. Sodium 128, potassium 7.3, chloride 98, bicarb 17, glucose 139, BUN 132 compared to baseline of 31, creatinine 4.36 compared to baseline of 1.75, alkaline phosphatase 64, albumin 3.6, AST 21, ALT 14, total protein 7.0, lactic acid 1.0, white count 7.1, hemoglobin 9.9, hematocrit 30.5, MCV 94.1, RDW 13.6, platelet count 516 CT scan of the head without contrast/cervical spine CT shows no acute intracranial findings or acute cervical spine  findings.  Cervical spondylosis and degenerative disc disease leading to moderate to prominent impingement at C5 -C6 and mild impingement at C2 -3, C3 -4, and C4 -5. Chest x-ray reviewed by me shows no acute cardiopulmonary disease Twelve-lead EKG reviewed by me shows sinus rhythm with peaked T waves in the inferior leads.   ED Course: Patient is a 76 year old who presents to the ER via EMS for evaluation of frequent falls at home associated with poor oral intake. By EMS he was hypotensive when they arrived with systolic blood pressure in the 70s and 80s. Labs reveal hyperkalemia with EKG changes as well as worsening renal function with serum creatinine 1.75 >> 4.36 and BUN 31 >> 131. Patient received calcium gluconate IV, insulin, sodium bicarbonate and Lokelma. Stools were heme positive and patient was started on Protonix drip in the ER. He will be admitted to the hospital for further evaluation.    Review of Systems: As per HPI otherwise all other systems reviewed and negative.    Past Medical History:  Diagnosis Date   Anxiety    Hypertension     Past Surgical History:  Procedure Laterality Date   COLON SURGERY       reports that he quit smoking about 59 years ago. His smoking use included cigarettes. He has a 2.00 pack-year smoking history. He has never used smokeless tobacco. He reports that he does not drink alcohol and does not use drugs.  Allergies  Allergen Reactions   Codeine Other (See Comments)  Penicillins Other (See Comments)    Unknown childhood reaction    History reviewed. No pertinent family history.  Parents are deceased  Prior to Admission medications   Medication Sig Start Date End Date Taking? Authorizing Provider  acetaminophen (TYLENOL) 325 MG tablet Take 1-2 tablets by mouth every 6 (six) hours as needed. 05/09/21  Yes [provider]  aspirin 81 MG tablet Take 81 mg by mouth daily.   Yes [provider]  lisinopril (ZESTRIL) 5  MG tablet Take 5 mg by mouth daily. 02/13/21  Yes [provider]  LORazepam (ATIVAN) 1 MG tablet Take 1 tablet by mouth every morning, 1/2 tablet at noon, 1/2 tablet at dinnertime and 1 tablet by mouth at bedtime 04/20/21  Yes [provider]  melatonin 3 MG TABS tablet Take 3 mg by mouth at bedtime. 05/09/21  Yes [provider]  polyethylene glycol powder (GLYCOLAX/MIRALAX) 17 GM/SCOOP powder Take 17 g by mouth daily as needed. 05/09/21 06/08/21 Yes [provider]  ramelteon (ROZEREM) 8 MG tablet Take 8 mg by mouth in the morning and at bedtime. 04/24/21  Yes [provider]  amitriptyline (ELAVIL) 100 MG tablet Take 300 mg by mouth at bedtime. Patient not taking: Reported on 05/21/2021    [provider]  amLODipine (NORVASC) 2.5 MG tablet Take by mouth. Patient not taking: Reported on 05/21/2021 01/10/21 01/10/22  [provider]  ascorbic acid (VITAMIN C) 500 MG tablet Take 500 mg by mouth daily.    [provider]  busPIRone (BUSPAR) 5 MG tablet Take 5 mg by mouth 2 (two) times daily. Patient not taking: Reported on 05/21/2021 01/16/21   [provider]  Cholecalciferol 125 MCG (5000 UT) TABS Take by mouth.    [provider]  doxycycline (VIBRA-TABS) 100 MG tablet Take 100 mg by mouth 2 (two) times daily. 01/17/21   [provider]  ferrous sulfate 325 (65 FE) MG tablet Take 325 mg by mouth in the morning.    [provider]  hydrOXYzine (ATARAX/VISTARIL) 25 MG tablet Take 1 tablet (25 mg total) by mouth every 8 (eight) hours as needed. Patient not taking: Reported on 05/21/2021 01/20/21   Margarette Canada, NP  QUEtiapine (SEROQUEL) 50 MG tablet Take 250 mg by mouth daily. 05/14/21   [provider]  rosuvastatin (CRESTOR) 20 MG tablet Take 20 mg by mouth daily. Patient not taking: Reported on 05/21/2021 01/02/21   [provider]  tamsulosin (FLOMAX) 0.4 MG CAPS capsule Take by  mouth. Patient not taking: Reported on 05/21/2021 01/16/21 01/16/22  [provider]  traZODone (DESYREL) 100 MG tablet Take 100 mg by mouth at bedtime. 04/23/21   [provider]  zinc sulfate 220 (50 Zn) MG capsule Take 220 mg by mouth daily.    [provider]    Physical Exam: Vitals:   05/21/21 1024 05/21/21 1030 05/21/21 1111 05/21/21 1200  BP:  (!) 78/51 (!) 94/53 (!) 119/52  Pulse:   72 74  Resp:  (!) 21 19 14   Temp:      TempSrc:      SpO2:   94% 91%  Weight: 72.6 kg     Height: 6\' 2"  (1.88 m)        Vitals:   05/21/21 1024 05/21/21 1030 05/21/21 1111 05/21/21 1200  BP:  (!) 78/51 (!) 94/53 (!) 119/52  Pulse:   72 74  Resp:  (!) 21 19 14   Temp:  TempSrc:      SpO2:   94% 91%  Weight: 72.6 kg     Height: 6\' 2"  (1.88 m)         Constitutional: Alert and oriented x 3 . Not in any apparent distress.  Chronically ill-appearing. HEENT:      Head: Normocephalic and atraumatic.         Eyes: PERLA, EOMI, Conjunctivae are normal. Sclera is non-icteric.       Mouth/Throat: Mucous membranes are dry       Neck: Supple with no signs of meningismus. Cardiovascular: Regular rate and rhythm. No murmurs, gallops, or rubs. 2+ symmetrical distal pulses are present . No JVD. No LE edema Respiratory: Respiratory effort normal .Lungs sounds clear bilaterally. No wheezes, crackles, or rhonchi.  Gastrointestinal: Soft, non tender, and non distended with positive bowel sounds. Ileostomy in place Genitourinary: No CVA tenderness. Musculoskeletal: Nontender with normal range of motion in all extremities. No cyanosis, or erythema of extremities. Neurologic:  Face is symmetric. Moving all extremities. No gross focal neurologic deficits. Generalized weakness Skin: Skin is warm, dry.  No rash or ulcers, poor skin turgor Psychiatric: Mood and affect are normal    Labs on Admission: I have personally reviewed following labs and imaging studies  CBC: Recent  Labs  Lab 05/21/21 1021  WBC 7.1  NEUTROABS 5.9  HGB 9.9*  HCT 30.5*  MCV 94.1  PLT 161*   Basic Metabolic Panel: Recent Labs  Lab 05/21/21 1021  NA 128*  K 7.3*  CL 98  CO2 17*  GLUCOSE 139*  BUN 132*  CREATININE 4.36*  CALCIUM 8.6*   GFR: Estimated Creatinine Clearance: 14.6 mL/min (A) (by C-G formula based on SCr of 4.36 mg/dL (H)). Liver Function Tests: Recent Labs  Lab 05/21/21 1021  AST 21  ALT 14  ALKPHOS 64  BILITOT 0.5  PROT 7.0  ALBUMIN 3.6   No results for input(s): LIPASE, AMYLASE in the last 168 hours. No results for input(s): AMMONIA in the last 168 hours. Coagulation Profile: Recent Labs  Lab 05/21/21 1021  INR 1.1   Cardiac Enzymes: No results for input(s): CKTOTAL, CKMB, CKMBINDEX, TROPONINI in the last 168 hours. BNP (last 3 results) No results for input(s): PROBNP in the last 8760 hours. HbA1C: No results for input(s): HGBA1C in the last 72 hours. CBG: No results for input(s): GLUCAP in the last 168 hours. Lipid Profile: No results for input(s): CHOL, HDL, LDLCALC, TRIG, CHOLHDL, LDLDIRECT in the last 72 hours. Thyroid Function Tests: No results for input(s): TSH, T4TOTAL, FREET4, T3FREE, THYROIDAB in the last 72 hours. Anemia Panel: No results for input(s): VITAMINB12, FOLATE, FERRITIN, TIBC, IRON, RETICCTPCT in the last 72 hours. Urine analysis:    Component Value Date/Time   COLORURINE STRAW (A) 01/08/2021 1210   APPEARANCEUR CLEAR (A) 01/08/2021 1210   LABSPEC 1.008 01/08/2021 1210   PHURINE 6.0 01/08/2021 1210   GLUCOSEU NEGATIVE 01/08/2021 1210   HGBUR NEGATIVE 01/08/2021 1210   BILIRUBINUR NEGATIVE 01/08/2021 1210   KETONESUR 5 (A) 01/08/2021 1210   PROTEINUR NEGATIVE 01/08/2021 1210   NITRITE NEGATIVE 01/08/2021 1210   LEUKOCYTESUR NEGATIVE 01/08/2021 1210    Radiological Exams on Admission: CT HEAD WO CONTRAST (5MM)  Result Date: 05/21/2021 CLINICAL DATA:  Multiple falls.  Weakness.  Loss of balance. EXAM: CT  HEAD WITHOUT CONTRAST CT CERVICAL SPINE WITHOUT CONTRAST TECHNIQUE: Multidetector CT imaging of the head and cervical spine was performed following the standard protocol without intravenous contrast. Multiplanar CT image  reconstructions of the cervical spine were also generated. RADIATION DOSE REDUCTION: This exam was performed according to the departmental dose-optimization program which includes automated exposure control, adjustment of the mA and/or kV according to patient size and/or use of iterative reconstruction technique. COMPARISON:  None. FINDINGS: CT HEAD FINDINGS Brain: Dilated perivascular spaces below the lentiform nuclei. The brainstem, cerebellum, cerebral peduncles, thalami, basal ganglia, basilar cisterns, and ventricular system appear within normal limits. No intracranial hemorrhage, mass lesion, or acute CVA. Vascular: Unremarkable Skull: Unremarkable Sinuses/Orbits: Mild chronic right maxillary and ethmoid sinusitis. Other: No supplemental non-categorized findings. CT CERVICAL SPINE FINDINGS Alignment: 1.5 mm degenerative posterior subluxation at C5-6. Skull base and vertebrae: No fracture or acute bony findings. Soft tissues and spinal canal: Incidental gas in the left internal jugular system, possibly from IV introduction, typically this is inconsequential in the absence of a right to left cardiac shown. Bilateral common carotid atherosclerotic calcification. Disc levels: C2-3: Mild left foraminal stenosis due to facet and uncinate spurring. C3-4: Mild left foraminal stenosis and mild central narrowing of the thecal sac due to facet and uncinate spurring, disc bulge, and a small central disc protrusion. C4-5: Mild left borderline right foraminal stenosis due to facet and uncinate spurring along with a mild disc bulge. C5-6: Moderate to prominent right foraminal stenosis due to facet and uncinate spurring. C6-7: Unremarkable. C7-T1: Unremarkable. Upper chest: Biapical pleuroparenchymal  scarring. Other: No supplemental non-categorized findings. IMPRESSION: 1. No acute intracranial findings or acute cervical spine findings. 2. 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 3. Cervical spondylosis and degenerative disc disease leading to moderate to prominent impingement at C5-6 and mild impingement at C2-3, C3-4, and C4-5. 4. Bilateral common carotid atherosclerotic calcification. Electronically Signed   By: Van Clines M.D.   On: 05/21/2021 11:24   CT Cervical Spine Wo Contrast  Result Date: 05/21/2021 CLINICAL DATA:  Multiple falls.  Weakness.  Loss of balance. EXAM: CT HEAD WITHOUT CONTRAST CT CERVICAL SPINE WITHOUT CONTRAST TECHNIQUE: Multidetector CT imaging of the head and cervical spine was performed following the standard protocol without intravenous contrast. Multiplanar CT image reconstructions of the cervical spine were also generated. RADIATION DOSE REDUCTION: This exam was performed according to the departmental dose-optimization program which includes automated exposure control, adjustment of the mA and/or kV according to patient size and/or use of iterative reconstruction technique. COMPARISON:  None. FINDINGS: CT HEAD FINDINGS Brain: Dilated perivascular spaces below the lentiform nuclei. The brainstem, cerebellum, cerebral peduncles, thalami, basal ganglia, basilar cisterns, and ventricular system appear within normal limits. No intracranial hemorrhage, mass lesion, or acute CVA. Vascular: Unremarkable Skull: Unremarkable Sinuses/Orbits: Mild chronic right maxillary and ethmoid sinusitis. Other: No supplemental non-categorized findings. CT CERVICAL SPINE FINDINGS Alignment: 1.5 mm degenerative posterior subluxation at C5-6. Skull base and vertebrae: No fracture or acute bony findings. Soft tissues and spinal canal: Incidental gas in the left internal jugular system, possibly from IV introduction, typically this is inconsequential in the absence of a right to  left cardiac shown. Bilateral common carotid atherosclerotic calcification. Disc levels: C2-3: Mild left foraminal stenosis due to facet and uncinate spurring. C3-4: Mild left foraminal stenosis and mild central narrowing of the thecal sac due to facet and uncinate spurring, disc bulge, and a small central disc protrusion. C4-5: Mild left borderline right foraminal stenosis due to facet and uncinate spurring along with a mild disc bulge. C5-6: Moderate to prominent right foraminal stenosis due to facet and uncinate spurring. C6-7: Unremarkable. C7-T1: Unremarkable. Upper chest: Biapical pleuroparenchymal scarring.  Other: No supplemental non-categorized findings. IMPRESSION: 1. No acute intracranial findings or acute cervical spine findings. 2. 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 3. Cervical spondylosis and degenerative disc disease leading to moderate to prominent impingement at C5-6 and mild impingement at C2-3, C3-4, and C4-5. 4. Bilateral common carotid atherosclerotic calcification. Electronically Signed   By: Van Clines M.D.   On: 05/21/2021 11:24   DG Chest Port 1 View  Result Date: 05/21/2021 CLINICAL DATA:  Questionable sepsis, multiple falls and increased weakness over past few days, hypertension EXAM: PORTABLE CHEST 1 VIEW COMPARISON:  Portable exam 1053 hours compared to 01/15/2021 FINDINGS: Normal heart size, mediastinal contours, and pulmonary vascularity. Lungs clear. No pulmonary infiltrate, pleural effusion, or pneumothorax. No fractures. IMPRESSION: No acute abnormalities. Electronically Signed   By: Lavonia Dana M.D.   On: 05/21/2021 11:55     Assessment/Plan Principal Problem:   Acute kidney injury ([place]) Active Problems:   Hyponatremia   Hyperkalemia   Physical deconditioning   Adenocarcinoma of colon (HCC)   CKD (chronic kidney disease), stage III [facility])    Patient is a 76 year old admitted to the hospital for acute kidney injury.    Acute kidney  injury with hyperkalemia Appears to be prerenal in the setting of poor oral intake and concomitant administration of oral antihypertensive medications. Patient with very dry mucous membrane and poor skin tugor Discontinue lisinopril and amlodipine Start patient on bicarbonate infusion Aggressive IV fluid resuscitation CK level pending.  Hold statins until CK results. Consult nephrology     Hyponatremia Secondary to poor oral intake Aggressive IV fluid hydration     History of hypertension with stage III chronic kidney disease Hold all antihypertensive medications for now due to relative hypotension and AKI      Adenocarcinoma of the colon Recently diagnosed following hospitalization at [facility] for bowel obstruction and found to have an ulcerated cecal mass. Patient is status post right hemicolectomy with end ileostomy and has been referred to medical oncology. Follow-up with oncology as an outpatient     Physical deconditioning with frequent falls Following recent hospitalization and surgical procedure Place patient on fall precautions PT evaluation Discussed with patient in detail that he most likely will need to go to subacute rehab upon discharge which he is not keen on due to his experience at the last rehab.     Heme positive stools Most likely secondary to iron supplementation Hemoglobin is stable at 9.9 but this may be due to hemoconcentration Continue Protonix started in the ER Check H&H    DVT prophylaxis: SCD  Code Status: DNR Family Communication: Greater than 50% of time was spent discussing patient's condition and plan of care with him at the bedside.  All questions and concerns have been addressed.  He verbalizes understanding and agrees with the plan.  CODE STATUS was discussed and he wishes to be a DO NOT RESUSCITATE.  He lists his son and states healthcare power of attorney. Disposition Plan: Subacute rehab Consults called:  Nephrology/gastroenterology Status:At the time of admission, it appears that the appropriate admission status for this patient is inpatient. This is judged to be reasonable and necessary to provide the required intensity of service to ensure the patient's safety given the presenting symptoms, physical exam findings, and initial radiographic and laboratory data in the context of their comorbid conditions. Patient requires inpatient status due to high intensity of service, high risk for further deterioration and high frequency of surveillance required.     Jakyron Fabro  Larine Fielding MD [facility]     05/21/2021, 1:03 PM

## 2021-05-22 DIAGNOSIS — R296 Repeated falls: Secondary | ICD-10-CM | POA: Diagnosis present

## 2021-05-22 DIAGNOSIS — N179 Acute kidney failure, unspecified: Secondary | ICD-10-CM | POA: Diagnosis not present

## 2021-05-22 DIAGNOSIS — W19XXXA Unspecified fall, initial encounter: Secondary | ICD-10-CM | POA: Diagnosis present

## 2021-05-22 LAB — BASIC METABOLIC PANEL
Anion gap: 8 (ref 5–15)
BUN: 94 mg/dL — ABNORMAL HIGH (ref 8–23)
CO2: 24 mmol/L (ref 22–32)
Calcium: 7.2 mg/dL — ABNORMAL LOW (ref 8.9–10.3)
Chloride: 101 mmol/L (ref 98–111)
Creatinine, Ser: 3.07 mg/dL — ABNORMAL HIGH (ref 0.61–1.24)
GFR, Estimated: 20 mL/min — ABNORMAL LOW (ref >60)
Glucose, Bld: 109 mg/dL — ABNORMAL HIGH (ref 70–99)
Potassium: 4.7 mmol/L (ref 3.5–5.1)
Sodium: 133 mmol/L — ABNORMAL LOW (ref 135–145)

## 2021-05-22 LAB — CBC
HCT: 23.1 % — ABNORMAL LOW (ref 39.0–52.0)
Hemoglobin: 7.7 g/dL — ABNORMAL LOW (ref 13.0–17.0)
MCH: 30.9 pg (ref 26.0–34.0)
MCHC: 33.3 g/dL (ref 30.0–36.0)
MCV: 92.8 fL (ref 80.0–100.0)
Platelets: 313 10*3/uL (ref 150–400)
RBC: 2.49 MIL/uL — ABNORMAL LOW (ref 4.22–5.81)
RDW: 13.7 % (ref 11.5–15.5)
WBC: 3.5 10*3/uL — ABNORMAL LOW (ref 4.0–10.5)
nRBC: 0 % (ref 0.0–0.2)

## 2021-05-22 MED ORDER — SODIUM CHLORIDE 0.9 % IV BOLUS
500.0000 mL | Freq: Once | INTRAVENOUS | Status: AC
  Administered 2021-05-22: 01:00:00 500 mL via INTRAVENOUS

## 2021-05-22 MED ORDER — LACTATED RINGERS IV SOLN: INTRAVENOUS | Status: DC

## 2021-05-22 NOTE — Assessment & Plan Note (Addendum)
Baseline Cr 1.75 in December. Presented with AKI.

## 2021-05-22 NOTE — Assessment & Plan Note (Addendum)
POA with Cr 4.36, improving with IV hydration.  Baseline Cr 1.75 on 04/25/21.   No obstruction on renal U/S. 1/27: Renal function improving, creatinine 1.85 today. Avoid nephrotoxins and hypotension. Treated with IV fluids. Nephrology following, see their recs

## 2021-05-22 NOTE — Hospital Course (Signed)
76 y.o. male with past medical history of anxiety, hypertension, and chronic kidney disease stage IIIb.  Patient presents to the emergency department with complaints of frequent falls and weakness.    Admitted for Acute kidney injury with creatinine on admission 4.36, BUN 132.

## 2021-05-22 NOTE — Assessment & Plan Note (Signed)
PT / OT evaluations Fall precautions.

## 2021-05-22 NOTE — Progress Notes (Signed)
°  Progress Note   Patient: Manuel Cummings OIZ:[id number] DOB: [birth year] DOA: 05/21/2021     1 DOS: the patient was seen and examined on 05/22/2021   Brief hospital course:  76 y.o. male with past medical history of anxiety, hypertension, and chronic kidney disease stage IIIb.  Patient presents to the emergency department with complaints of frequent falls and weakness.    Admitted for Acute kidney injury with creatinine on admission 4.36, BUN 132.  Assessment and Plan Falls- (present on admission) Pt had just returned home about a week ago after rehab stay following his hemicolectomy.  Had multiple falls at home, pt attributes to his sleep medication.  Likely related to uremia and electrolyte derangements. --PT/OT consults --Fall precautions  AKI (acute kidney injury) ([place])- (present on admission) POA with Cr 4.36, improving with IV hydration.  Baseline Cr 1.75 on 04/25/21.  No obstruction on renal U/S. Avoid nephrotoxins and hypotension. Continue IV fluids Nephrology following, see their recs  CKD (chronic kidney disease), stage III ([place])- (present on admission) Baseline Cr 1.75 in December. Currently with AKI.  Adenocarcinoma of colon [facility])- (present on admission) Status post resection on May 01, 2021 for an obstructing mass in his ascending colon resulting in a right hemicolectomy with end ileostomy.  Followed by oncology for adenocarcinoma.  --Ostomy care --Monitor  Physical deconditioning- (present on admission) PT / OT evaluations Fall precautions.  Hyperkalemia- (present on admission) Resolved.  POA with K 7.3. --Monitor BMP  Hyponatremia- (present on admission) POA with Na 128. Improving with IV fluids. Monitor BMP.     Subjective: Pt seen in the ED holding for a bed.  PT evaluation just wrapping up.  PT reports pt is impulsive and seems to lack awareness of falls risk.  Pt reports feeling better.  Blames falls on "medications" but not to change what was given  last night because it was perfect.  He does not elaborate on what he means by that.  Wants to eat lunch.  Denies acute complaints.  Asks when he can go home.   Objective Vitals reviewed, notable for soft BP 85/48 overnight, improved this morning.  No fevers, HR controlled, no hypoxia.  General exam: awake, alert, no acute distress HEENT: small abrasion on nose, moist mucus membranes, hearing grossly normal  Respiratory system: CTAB, no wheezes, rales or rhonchi, normal respiratory effort. Cardiovascular system: normal S1/S2,  RRR, no JVD, murmurs, rubs, gallops,  no pedal edema.   Gastrointestinal system: soft, NT, ND, no HSM felt, +bowel sounds. Central nervous system: no gross focal neurologic deficits, normal speech Extremities: moves all, no edema, normal tone Skin: dry, intact, normal temperature, Psychiatry: normal mood, congruent affect, abnormal judgement and insight    Data Reviewed:  Labs notable for  Na 133 improving, K normalized 4.7, glucose 109, BUN 94, Cr improved to 3.07, WBC 3.5, hbg 7.7  Family Communication: none present  Disposition: Status is: Inpatient  Remains inpatient appropriate because: acute renal failure continuing on IV fluids         Time spent: 35 minutes  Author: Ezekiel Slocumb, DO 05/22/2021 4:49 PM  For on call review [email or web address].

## 2021-05-22 NOTE — ED Notes (Signed)
ED TO INPATIENT HANDOFF REPORT  ED Nurse Name and Phone #: Baxter Flattery, RN  S Name/Age/Gender Manuel Cummings 76 y.o. male Room/Bed: ED19A/ED19A  Code Status   Code Status: DNR  Home/SNF/Other Home Patient oriented to: self, place, time, and situation Is this baseline? Yes   Triage Complete: Triage complete  Chief Complaint Acute kidney injury [facility]) [N17.9]  Triage Note Pt arrived by EMS from home, pt lives alone Pt has had multiple falls and increased weakness of the past few days. Pt state he has fallen about 6 times over past 3 days, state he losses balance and gets weak. Denies feeling lightheaded or dizzy   Pt has low BP during transport systolic 51W and 25E  Alert and oriented. CBG 174    Allergies Allergies  Allergen Reactions   Codeine Other (See Comments)   Penicillins Other (See Comments)    Unknown childhood reaction    Level of Care/Admitting Diagnosis ED Disposition     ED Disposition  Admit   Condition  --   [place]: [place] [[id number]]  Level of Care: Progressive [102]  Admit to Progressive based on following criteria: NEPHROLOGY stable condition requiring close monitoring for AKI, requiring Hemodialysis or Peritoneal Dialysis either from expected electrolyte imbalance, acidosis, or fluid overload that can be managed by NIPPV or high flow oxygen.  Covid Evaluation: Asymptomatic Screening Protocol (No Symptoms)  Diagnosis: Acute kidney injury [facility]) [[id number]]  Admitting Physician: Gary Fleet  Attending Physician: Gary Fleet  Estimated length of stay: past midnight tomorrow  Certification:: I certify this patient will need inpatient services for at least 2 midnights          B Medical/Surgery History Past Medical History:  Diagnosis Date   Anxiety    Hypertension    Past Surgical History:  Procedure Laterality Date   COLON SURGERY       A IV Location/Drains/Wounds Patient  Lines/Drains/Airways Status     Active Line/Drains/Airways     Name Placement date Placement time Site Days   Peripheral IV 05/21/21 20 G Left Antecubital 05/21/21  1038  Antecubital  1   Peripheral IV 05/21/21 20 G Anterior;Distal;Right;Upper Arm 05/21/21  1052  Arm  1   External Urinary Catheter 05/22/21  0153  --  less than 1            Intake/Output Last 24 hours  Intake/Output Summary (Last 24 hours) at 05/22/2021 1127 Last data filed at 05/22/2021 1037 Gross per 24 hour  Intake 3811.08 ml  Output 2250 ml  Net 1561.08 ml    Labs/Imaging Results for orders placed or performed during the hospital encounter of 05/21/21 (from the past 48 hour(s))  Comprehensive metabolic panel     Status: Abnormal   Collection Time: 05/21/21 10:21 AM  Result Value Ref Range   Sodium 128 (L) 135 - 145 mmol/L   Potassium 7.3 (HH) 3.5 - 5.1 mmol/L    Comment: CRITICAL RESULT CALLED TO, READ BACK BY AND VERIFIED WITH JACQUE DOD RN @1125  05/21/21 SCS    Chloride 98 98 - 111 mmol/L   CO2 17 (L) 22 - 32 mmol/L   Glucose, Bld 139 (H) 70 - 99 mg/dL    Comment: Glucose reference range applies only to samples taken after fasting for at least 8 hours.   BUN 132 (H) 8 - 23 mg/dL    Comment: RESULT CONFIRMED BY MANUAL DILUTION SCS   Creatinine, Ser 4.36 (H) 0.61 -  1.24 mg/dL   Calcium 8.6 (L) 8.9 - 10.3 mg/dL   Total Protein 7.0 6.5 - 8.1 g/dL   Albumin 3.6 3.5 - 5.0 g/dL   AST 21 15 - 41 U/L   ALT 14 0 - 44 U/L   Alkaline Phosphatase 64 38 - 126 U/L   Total Bilirubin 0.5 0.3 - 1.2 mg/dL   GFR, Estimated 13 (L) >60 mL/min    Comment: (NOTE) Calculated using the CKD-EPI Creatinine Equation (2021)    Anion gap 13 5 - 15    Comment: Performed at [facility], [address] [place], [place] [address]  CBC WITH DIFFERENTIAL     Status: Abnormal   Collection Time: 05/21/21 10:21 AM  Result Value Ref Range   WBC 7.1 4.0 - 10.5 K/uL   RBC 3.24 (L) 4.22 - 5.81 MIL/uL   Hemoglobin 9.9  (L) 13.0 - 17.0 g/dL   HCT 30.5 (L) 39.0 - 52.0 %   MCV 94.1 80.0 - 100.0 fL   MCH 30.6 26.0 - 34.0 pg   MCHC 32.5 30.0 - 36.0 g/dL   RDW 13.6 11.5 - 15.5 %   Platelets 516 (H) 150 - 400 K/uL   nRBC 0.0 0.0 - 0.2 %   Neutrophils Relative % 84 %   Neutro Abs 5.9 1.7 - 7.7 K/uL   Lymphocytes Relative 8 %   Lymphs Abs 0.6 (L) 0.7 - 4.0 K/uL   Monocytes Relative 8 %   Monocytes Absolute 0.6 0.1 - 1.0 K/uL   Eosinophils Relative 0 %   Eosinophils Absolute 0.0 0.0 - 0.5 K/uL   Basophils Relative 0 %   Basophils Absolute 0.0 0.0 - 0.1 K/uL   Immature Granulocytes 0 %   Abs Immature Granulocytes 0.03 0.00 - 0.07 K/uL    Comment: Performed at [facility], [address] [place], [place] [address]  Protime-INR     Status: None   Collection Time: 05/21/21 10:21 AM  Result Value Ref Range   Prothrombin Time 13.7 11.4 - 15.2 seconds   INR 1.1 0.8 - 1.2    Comment: (NOTE) INR goal varies based on device and disease states. Performed at [facility], [address] [place], [place] [address]   APTT     Status: None   Collection Time: 05/21/21 10:21 AM  Result Value Ref Range   aPTT 29 24 - 36 seconds    Comment: Performed at [facility], [address] [place], [place] [address]  Lactic acid, plasma     Status: None   Collection Time: 05/21/21 10:44 AM  Result Value Ref Range   Lactic Acid, Venous 1.0 0.5 - 1.9 mmol/L    Comment: Performed at [facility], [address] [place],  [address]  Urinalysis, Complete w Microscopic     Status: Abnormal   Collection Time: 05/21/21 10:44 AM  Result Value Ref Range   Color, Urine YELLOW (A) YELLOW   APPearance CLEAR (A) CLEAR   Specific Gravity, Urine 1.015 1.005 - 1.030   pH 5.0 5.0 - 8.0   Glucose, UA NEGATIVE NEGATIVE mg/dL   Hgb urine dipstick NEGATIVE NEGATIVE   Bilirubin Urine NEGATIVE NEGATIVE   Ketones, ur NEGATIVE NEGATIVE mg/dL   Protein, ur NEGATIVE NEGATIVE mg/dL   Nitrite  NEGATIVE NEGATIVE   Leukocytes,Ua NEGATIVE NEGATIVE   RBC / HPF 0-5 0 - 5 RBC/hpf   WBC, UA 0-5 0 - 5 WBC/hpf   Bacteria, UA RARE (A)  NONE SEEN   Squamous Epithelial / LPF 0-5 0 - 5   Mucus PRESENT    Hyaline Casts, UA PRESENT     Comment: Performed at [facility], [address] [place], [place] [address]  Resp Panel by RT-PCR (Flu A&B, Covid) Nasopharyngeal Swab     Status: None   Collection Time: 05/21/21 12:48 PM   Specimen: Nasopharyngeal Swab; Nasopharyngeal(NP) swabs in vial transport medium  Result Value Ref Range   SARS Coronavirus 2 by RT PCR NEGATIVE NEGATIVE    Comment: (NOTE) SARS-CoV-2 target nucleic acids are NOT DETECTED.  The SARS-CoV-2 RNA is generally detectable in upper respiratory specimens during the acute phase of infection. The lowest concentration of SARS-CoV-2 viral copies this assay can detect is 138 copies/mL. A negative result does not preclude SARS-Cov-2 infection and should not be used as the sole basis for treatment or other patient management decisions. A negative result may occur with  improper specimen collection/handling, submission of specimen other than nasopharyngeal swab, presence of viral mutation(s) within the areas targeted by this assay, and inadequate number of viral copies(<138 copies/mL). A negative result must be combined with clinical observations, patient history, and epidemiological information. The expected result is Negative.  Fact Sheet for Patients:  EntrepreneurPulse.com.au  Fact Sheet for Healthcare Providers:  IncredibleEmployment.be  This test is no t yet approved or cleared by the [place] FDA and  has been authorized for detection and/or diagnosis of SARS-CoV-2 by FDA under an Emergency Use Authorization (EUA). This EUA will remain  in effect (meaning this test can be used) for the duration of the COVID-19 declaration under Section 564(b)(1) of the Act,  21 U.S.C.section 360bbb-3(b)(1), unless the authorization is terminated  or revoked sooner.       Influenza A by PCR NEGATIVE NEGATIVE   Influenza B by PCR NEGATIVE NEGATIVE    Comment: (NOTE) The Xpert Xpress SARS-CoV-2/FLU/RSV plus assay is intended as an aid in the diagnosis of influenza from Nasopharyngeal swab specimens and should not be used as a sole basis for treatment. Nasal washings and aspirates are unacceptable for Xpert Xpress SARS-CoV-2/FLU/RSV testing.  Fact Sheet for Patients: EntrepreneurPulse.com.au  Fact Sheet for Healthcare Providers: IncredibleEmployment.be  This test is not yet approved or cleared by the [place] FDA and has been authorized for detection and/or diagnosis of SARS-CoV-2 by FDA under an Emergency Use Authorization (EUA). This EUA will remain in effect (meaning this test can be used) for the duration of the COVID-19 declaration under Section 564(b)(1) of the Act, 21 U.S.C. section 360bbb-3(b)(1), unless the authorization is terminated or revoked.  Performed at [facility], [address] [place], [place] [address]   Lactic acid, plasma     Status: None   Collection Time: 05/21/21  6:38 PM  Result Value Ref Range   Lactic Acid, Venous 1.2 0.5 - 1.9 mmol/L    Comment: Performed at [facility], [address] [place], [place] [address]  Basic metabolic panel     Status: Abnormal   Collection Time: 05/21/21  6:38 PM  Result Value Ref Range   Sodium 130 (L) 135 - 145 mmol/L   Potassium 5.5 (H) 3.5 - 5.1 mmol/L   Chloride 98 98 - 111 mmol/L   CO2 20 (L) 22 - 32 mmol/L   Glucose, Bld 123 (H) 70 - 99 mg/dL    Comment: Glucose reference range applies only to samples taken after fasting for at least 8 hours.   BUN 115 (  H) 8 - 23 mg/dL    Comment: RESULT CONFIRMED BY MANUAL DILUTION SCS   Creatinine, Ser 3.84 (H) 0.61 - 1.24 mg/dL   Calcium 8.0 (L) 8.9 - 10.3 mg/dL   GFR,  Estimated 15 (L) >60 mL/min    Comment: (NOTE) Calculated using the CKD-EPI Creatinine Equation (2021)    Anion gap 12 5 - 15    Comment: Performed at [facility], [address] [place], [place] [address]  CK     Status: Abnormal   Collection Time: 05/21/21  6:38 PM  Result Value Ref Range   Total CK 506 (H) 49 - 397 U/L    Comment: Performed at [facility], [address] [place], [place] [address]  Basic metabolic panel     Status: Abnormal   Collection Time: 05/22/21  4:59 AM  Result Value Ref Range   Sodium 133 (L) 135 - 145 mmol/L   Potassium 4.7 3.5 - 5.1 mmol/L   Chloride 101 98 - 111 mmol/L   CO2 24 22 - 32 mmol/L   Glucose, Bld 109 (H) 70 - 99 mg/dL    Comment: Glucose reference range applies only to samples taken after fasting for at least 8 hours.   BUN 94 (H) 8 - 23 mg/dL    Comment: RESULT CONFIRMED BY MANUAL DILUTION RH   Creatinine, Ser 3.07 (H) 0.61 - 1.24 mg/dL   Calcium 7.2 (L) 8.9 - 10.3 mg/dL   GFR, Estimated 20 (L) >60 mL/min    Comment: (NOTE) Calculated using the CKD-EPI Creatinine Equation (2021)    Anion gap 8 5 - 15    Comment: Performed at [facility], [address] [place], [place] [address]  CBC     Status: Abnormal   Collection Time: 05/22/21  4:59 AM  Result Value Ref Range   WBC 3.5 (L) 4.0 - 10.5 K/uL   RBC 2.49 (L) 4.22 - 5.81 MIL/uL   Hemoglobin 7.7 (L) 13.0 - 17.0 g/dL   HCT 23.1 (L) 39.0 - 52.0 %   MCV 92.8 80.0 - 100.0 fL   MCH 30.9 26.0 - 34.0 pg   MCHC 33.3 30.0 - 36.0 g/dL   RDW 13.7 11.5 - 15.5 %   Platelets 313 150 - 400 K/uL   nRBC 0.0 0.0 - 0.2 %    Comment: Performed at [facility], [address] [place], [place] [address]   CT HEAD WO CONTRAST (5MM)  Result Date: 05/21/2021 CLINICAL DATA:  Multiple falls.  Weakness.  Loss of balance. EXAM: CT HEAD WITHOUT CONTRAST CT CERVICAL SPINE WITHOUT CONTRAST TECHNIQUE: Multidetector CT imaging of the head and cervical spine was  performed following the standard protocol without intravenous contrast. Multiplanar CT image reconstructions of the cervical spine were also generated. RADIATION DOSE REDUCTION: This exam was performed according to the departmental dose-optimization program which includes automated exposure control, adjustment of the mA and/or kV according to patient size and/or use of iterative reconstruction technique. COMPARISON:  None. FINDINGS: CT HEAD FINDINGS Brain: Dilated perivascular spaces below the lentiform nuclei. The brainstem, cerebellum, cerebral peduncles, thalami, basal ganglia, basilar cisterns, and ventricular system appear within normal limits. No intracranial hemorrhage, mass lesion, or acute CVA. Vascular: Unremarkable Skull: Unremarkable Sinuses/Orbits: Mild chronic right maxillary and ethmoid sinusitis. Other: No supplemental non-categorized findings. CT CERVICAL SPINE FINDINGS Alignment: 1.5 mm degenerative posterior subluxation at C5-6. Skull base and vertebrae: No fracture or acute bony findings. Soft tissues and spinal canal: Incidental gas in  the left internal jugular system, possibly from IV introduction, typically this is inconsequential in the absence of a right to left cardiac shown. Bilateral common carotid atherosclerotic calcification. Disc levels: C2-3: Mild left foraminal stenosis due to facet and uncinate spurring. C3-4: Mild left foraminal stenosis and mild central narrowing of the thecal sac due to facet and uncinate spurring, disc bulge, and a small central disc protrusion. C4-5: Mild left borderline right foraminal stenosis due to facet and uncinate spurring along with a mild disc bulge. C5-6: Moderate to prominent right foraminal stenosis due to facet and uncinate spurring. C6-7: Unremarkable. C7-T1: Unremarkable. Upper chest: Biapical pleuroparenchymal scarring. Other: No supplemental non-categorized findings. IMPRESSION: 1. No acute intracranial findings or acute cervical spine  findings. 2. 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 3. Cervical spondylosis and degenerative disc disease leading to moderate to prominent impingement at C5-6 and mild impingement at C2-3, C3-4, and C4-5. 4. Bilateral common carotid atherosclerotic calcification. Electronically Signed   By: Van Clines M.D.   On: 05/21/2021 11:24   CT Cervical Spine Wo Contrast  Result Date: 05/21/2021 CLINICAL DATA:  Multiple falls.  Weakness.  Loss of balance. EXAM: CT HEAD WITHOUT CONTRAST CT CERVICAL SPINE WITHOUT CONTRAST TECHNIQUE: Multidetector CT imaging of the head and cervical spine was performed following the standard protocol without intravenous contrast. Multiplanar CT image reconstructions of the cervical spine were also generated. RADIATION DOSE REDUCTION: This exam was performed according to the departmental dose-optimization program which includes automated exposure control, adjustment of the mA and/or kV according to patient size and/or use of iterative reconstruction technique. COMPARISON:  None. FINDINGS: CT HEAD FINDINGS Brain: Dilated perivascular spaces below the lentiform nuclei. The brainstem, cerebellum, cerebral peduncles, thalami, basal ganglia, basilar cisterns, and ventricular system appear within normal limits. No intracranial hemorrhage, mass lesion, or acute CVA. Vascular: Unremarkable Skull: Unremarkable Sinuses/Orbits: Mild chronic right maxillary and ethmoid sinusitis. Other: No supplemental non-categorized findings. CT CERVICAL SPINE FINDINGS Alignment: 1.5 mm degenerative posterior subluxation at C5-6. Skull base and vertebrae: No fracture or acute bony findings. Soft tissues and spinal canal: Incidental gas in the left internal jugular system, possibly from IV introduction, typically this is inconsequential in the absence of a right to left cardiac shown. Bilateral common carotid atherosclerotic calcification. Disc levels: C2-3: Mild left foraminal stenosis due to  facet and uncinate spurring. C3-4: Mild left foraminal stenosis and mild central narrowing of the thecal sac due to facet and uncinate spurring, disc bulge, and a small central disc protrusion. C4-5: Mild left borderline right foraminal stenosis due to facet and uncinate spurring along with a mild disc bulge. C5-6: Moderate to prominent right foraminal stenosis due to facet and uncinate spurring. C6-7: Unremarkable. C7-T1: Unremarkable. Upper chest: Biapical pleuroparenchymal scarring. Other: No supplemental non-categorized findings. IMPRESSION: 1. No acute intracranial findings or acute cervical spine findings. 2. 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 3. Cervical spondylosis and degenerative disc disease leading to moderate to prominent impingement at C5-6 and mild impingement at C2-3, C3-4, and C4-5. 4. Bilateral common carotid atherosclerotic calcification. Electronically Signed   By: Van Clines M.D.   On: 05/21/2021 11:24   US RENAL  Result Date: 05/21/2021 CLINICAL DATA:  Acute kidney injury EXAM: RENAL / URINARY TRACT ULTRASOUND COMPLETE COMPARISON:  None. FINDINGS: Right Kidney: Renal measurements: 8.7 x 3.8 x 3.4 cm = volume: 59 mL. Echogenicity within normal limits. No mass or hydronephrosis visualized. Left Kidney: Renal measurements: 9.8 x 4.8 x 4.3 cm = volume: 106  mL. Echogenicity within normal limits. No mass or hydronephrosis visualized. Bladder: Appears normal for degree of bladder distention. Other: None. IMPRESSION: Small right kidney.  No obstruction or mass lesion. Electronically Signed   By: Franchot Gallo M.D.   On: 05/21/2021 17:13   DG Chest Port 1 View  Result Date: 05/21/2021 CLINICAL DATA:  Questionable sepsis, multiple falls and increased weakness over past few days, hypertension EXAM: PORTABLE CHEST 1 VIEW COMPARISON:  Portable exam 1053 hours compared to 01/15/2021 FINDINGS: Normal heart size, mediastinal contours, and pulmonary vascularity. Lungs  clear. No pulmonary infiltrate, pleural effusion, or pneumothorax. No fractures. IMPRESSION: No acute abnormalities. Electronically Signed   By: Lavonia Dana M.D.   On: 05/21/2021 11:55    Pending Labs Unresulted Labs (From admission, onward)     Start     Ordered   05/21/21 1146  Occult blood card to lab, stool  Once,   STAT        05/21/21 1145            Vitals/Pain Today's Vitals   05/22/21 0500 05/22/21 0530 05/22/21 0600 05/22/21 0927  BP: (!) 109/59 (!) 105/40 (!) 127/46 (!) 125/55  Pulse: 81 81 81 86  Resp: 18 12 15 18   Temp:    98 F (36.7 C)  TempSrc:    Oral  SpO2: 100% 100% 100% 100%  Weight:      Height:      PainSc:    0-No pain    Isolation Precautions No active isolations  Medications Medications  pantoprozole (PROTONIX) 80 mg /NS 100 mL infusion (8 mg/hr Intravenous New Bag/Given 05/22/21 0633)  pantoprazole (PROTONIX) injection 40 mg (has no administration in time range)  tamsulosin (FLOMAX) capsule 0.4 mg (0.4 mg Oral Given 05/22/21 0925)  traMADol (ULTRAM) tablet 50 mg (50 mg Oral Given 05/21/21 2014)  ondansetron (ZOFRAN) tablet 4 mg (has no administration in time range)    Or  ondansetron (ZOFRAN) injection 4 mg (has no administration in time range)  fentaNYL (SUBLIMAZE) injection 50 mcg (has no administration in time range)  triazolam (HALCION) tablet 0.25 mg (0.25 mg Oral Given 05/21/21 2010)  ferrous sulfate tablet 325 mg (325 mg Oral Given 05/22/21 0507)  melatonin tablet 2.5 mg (2.5 mg Oral Given 05/21/21 2009)  ascorbic acid (VITAMIN C) tablet 500 mg (500 mg Oral Given 05/22/21 0925)  cholecalciferol (VITAMIN D3) tablet 5,000 Units (5,000 Units Oral Given 05/22/21 0925)  LORazepam (ATIVAN) tablet 0.5 mg (0.5 mg Oral Given 05/22/21 0507)  traZODone (DESYREL) tablet 100 mg (100 mg Oral Given 05/21/21 2009)  QUEtiapine (SEROQUEL) tablet 200 mg (200 mg Oral Given 05/21/21 2010)  lactated ringers infusion ( Intravenous New Bag/Given 05/22/21 1045)   lactated ringers bolus 1,000 mL (0 mLs Intravenous Stopped 05/21/21 1130)  calcium gluconate 1 g/ 50 mL sodium chloride IVPB (0 mg Intravenous Stopped 05/21/21 1230)  sodium bicarbonate injection 50 mEq (50 mEq Intravenous Given 05/21/21 1154)  insulin aspart (novoLOG) injection 5 Units (5 Units Intravenous Given 05/21/21 1154)  0.9 %  sodium chloride infusion (0 mLs Intravenous Stopped 05/21/21 1240)  pantoprazole (PROTONIX) 80 mg /NS 100 mL IVPB (0 mg Intravenous Stopped 05/21/21 1259)  sodium chloride 0.9 % bolus 1,000 mL (0 mLs Intravenous Stopped 05/21/21 1255)  lactated ringers bolus 500 mL (0 mLs Intravenous Stopped 05/22/21 0039)  sodium chloride 0.9 % bolus 500 mL (0 mLs Intravenous Stopped 05/22/21 0203)    Mobility walks High fall risk   Focused Assessments Cardiac  Assessment Handoff:  Cardiac Rhythm: Normal sinus rhythm Lab Results  Component Value Date   CKTOTAL 506 (H) 05/21/2021   No results found for: DDIMER Does the Patient currently have chest pain? No    R Recommendations: See Admitting Provider Note  Report given to:   Additional Notes:

## 2021-05-22 NOTE — ED Notes (Signed)
Notified NP Foust of this Pt's lab results

## 2021-05-22 NOTE — ED Notes (Signed)
Tara RN aware of assigned bed °

## 2021-05-22 NOTE — Assessment & Plan Note (Addendum)
Pt had just returned home about a week ago after rehab stay following his hemicolectomy.  Had multiple falls at home, pt attributes to his sleep medication.  Likely related to uremia and electrolyte derangements. 1/26: Patient found to have significant orthostatic hypotension likely contributing.  Patient was noncompliant with fall precautions and attempted to get out of bed on his own resulting in a fall.  Head CT was negative and patient denies any injuries as a result. --PT/OT consults --Fall precautions --Started on midodrine to help with orthostatic symptoms

## 2021-05-22 NOTE — TOC Initial Note (Signed)
Transition of Care [facility]) - Initial/Assessment Note    Patient Details  Name: Manuel Cummings MRN: [id number] Date of Birth: 1945/07/22  Transition of Care [facility]) CM/SW Contact:    Eileen Stanford, LCSW Phone Number: 05/22/2021, 1:32 PM  Clinical Narrative:    CSW spoke with pt regarding HH recommendation. Pt is on board. Pt is requesting [facility] as he has them in the past. Pt also declines any DME. Pt states he has a walker at home.  Pt's son help get him to appointments. Pt goes to [facility] in [place].           Expected Discharge Plan: [place] Barriers to Discharge: Continued Medical Work up   Patient Goals and CMS Choice Patient states their goals for this hospitalization and ongoing recovery are:: to go home CMS Medicare.gov Compare Post Acute Care list provided to:: Patient Choice offered to / list presented to : Patient  Expected Discharge Plan and Services Expected Discharge Plan: [place] Choice: [place] arrangements for the past 2 months: Single Family Home                           HH Arranged: PT [place]: [place] Date [place]: 05/22/21 Time [place]: 1331 Representative spoke with at [place]: [place] Arrangements/Services Living arrangements for the past 2 months: [place] Lives with:: Adult Children Patient language and need for interpreter reviewed:: Yes Do you feel safe going back to the place where you live?: Yes      Need for Family Participation in Patient Care: Yes (Comment) Care giver support system in place?: Yes (comment)   Criminal Activity/Legal Involvement Pertinent to Current Situation/Hospitalization: No - Comment as needed  Activities of Daily Living      Permission Sought/Granted Permission sought to share information with : Family Supports    Share Information with NAME:  Selinda Flavin  Permission granted to share info w AGENCY: [facility]  Permission granted to share info w Relationship: son     Emotional Assessment Appearance:: Appears stated age Attitude/Demeanor/Rapport: Engaged Affect (typically observed): Accepting Orientation: : Oriented to Self, Oriented to Place, Oriented to  Time, Oriented to Situation Alcohol / Substance Use: Not Applicable Psych Involvement: No (comment)  Admission diagnosis:  Hyperkalemia [E87.5] AKI (acute kidney injury) ([place]) [N17.9] Acute kidney injury ([place]) [N17.9] Patient Active Problem List   Diagnosis Date Noted   AKI (acute kidney injury) ([place]) 05/22/2021   Acute kidney injury ([place]) 05/21/2021   Hyponatremia 05/21/2021   Hyperkalemia 05/21/2021   Physical deconditioning 05/21/2021   Adenocarcinoma of colon ([place]) 05/21/2021   CKD (chronic kidney disease), stage III ([place]) 05/21/2021   Hypertension 01/15/2021   PCP:  L[facility]:   [facility] [address] [place] [address] Phone: [phone] Fax: [phone]     Social Determinants of Health (SDOH) Interventions    Readmission Risk Interventions Readmission Risk Prevention Plan 05/22/2021  Transportation Screening Complete  Some recent data might be hidden

## 2021-05-22 NOTE — Assessment & Plan Note (Addendum)
POA with Na 128. Improved with IV fluids.  Monitor BMP in follow up.

## 2021-05-22 NOTE — Assessment & Plan Note (Addendum)
Resolved.  POA with K 7.3. K today 4.7, normal. --Monitor BMP in follow up

## 2021-05-22 NOTE — Progress Notes (Signed)
Mobility Specialist - Progress Note   05/22/21 1500  Mobility  Activity Ambulated with assistance in room;Stood at bedside  Range of Motion/Exercises Active  Level of Assistance Standby assist, set-up cues, supervision of patient - no hands on  Assistive Device None  Distance Ambulated (ft) 10 ft  Activity Response Tolerated well  $Mobility charge 1 Mobility    Mobility responded to bed alarm. Upon arrival, pt standing at bedside with BM running down side of bed and on floor. NT called and entered to assist with bed linen change and clean-up. Pt demonstrates poor safety awareness---trying to ambulate around room without concern for IV lines and connections. Pt upset about bed alarm being on and despite lengthy education on fall prevention and safety awareness from mobility specialist, RN, and Charge RN---pt still very adamant about refusing alarm. Pt left EOB with needs in reach. Nursing staff working to move pt closer to nursing station d/t being a high fall risk.    Kathee Delton Mobility Specialist 05/22/21, 3:25 PM

## 2021-05-22 NOTE — Evaluation (Signed)
Physical Therapy Evaluation Patient Details Name: Manuel Cummings MRN: [id number] DOB: 05/06/1945 Today's Date: 05/22/2021  History of Present Illness  Manuel Cummings is a 76 y.o. male with past medical history of anxiety, hypertension, and chronic kidney disease stage IIIb.  Patient presents to the emergency department with complaints of frequent falls and weakness.    Clinical Impression  Pt received in supine position and agreeable to therapy.  Pt performed bed mobility well with no assistance.  Pt does have some safety awareness deficits, with wanting to stand up and use the purewick in standing without therapist being near by.  Pt advised and educated on the importance of being safe with transfers and waiting for staff members to assist in order to prevent continued falls.  Pt verbally agreed, but does not demonstrate proper safety awareness throughout session.  Pt provided assistance when emptying out colostomy bag, and assisted with lines/leads management for all tasks.  Pt then transferred to standing position from elevated ED bed with no complications and use of the RW.  Pt much more stable with RW and insists he needs one, rather than his SPC.  Pt ambulated around the nursing station with good technique and no difficulty in the hallway.  Pt attempted to transfer to bed, however due to lack of safety awareness and warning from therapist, pt moved towards the bed and did not wait until therapist could prevent IV from being pulled out of arm/site.  Nursing notified and in room at conclusion of therapy to correct.  Pt left sitting at EOB eating lunch with nursing in room.  Pt will need to attempt stairs for safe d/c home.  Pt will benefit from skilled PT intervention to increase independence and safety with basic mobility in preparation for discharge to the venue listed below.     Pt has 2 sons that can assist, but live in [place] and [place], so unlikely to be able to assist for longer  periods of time.       Recommendations for follow up therapy are one component of a multi-disciplinary discharge planning process, led by the attending physician.  Recommendations may be updated based on patient status, additional functional criteria and insurance authorization.  Follow Up Recommendations Home health PT    Assistance Recommended at Discharge PRN  Patient can return home with the following       Equipment Recommendations Rolling walker (2 wheels)  Recommendations for Other Services       Functional Status Assessment Patient has had a recent decline in their functional status and demonstrates the ability to make significant improvements in function in a reasonable and predictable amount of time.     Precautions / Restrictions Precautions Precautions: None Restrictions Weight Bearing Restrictions: No      Mobility  Bed Mobility Overal bed mobility: Modified Independent                  Transfers Overall transfer level: Needs assistance Equipment used: None Transfers: Sit to/from Stand Sit to Stand: Min guard           General transfer comment: Pt able to come upright into standing from elevated ED bed.    Ambulation/Gait Ambulation/Gait assistance: Min guard Gait Distance (Feet): 160 Feet Assistive device: Rolling walker (2 wheels) Gait Pattern/deviations: WFL(Within Functional Limits) Gait velocity: decreased     General Gait Details: Pt ambulates well with RW, but has decreased safety awareness.  Stairs  Wheelchair Mobility    Modified [place] (Stroke Patients Only)       Balance Overall balance assessment: Needs assistance Sitting-balance support: Feet supported, No upper extremity supported Sitting balance-Leahy Scale: Good     Standing balance support: No upper extremity supported, Reliant on assistive device for balance, During functional activity Standing balance-Leahy Scale: Poor Standing balance  comment: Pt attempted to stand  without support in order to urinate into purewick, but has posterior balance deficits throughout.  Much better with walker support.                             Pertinent Vitals/Pain Pain Assessment Pain Assessment: No/denies pain    Home Living Family/patient expects to be discharged to:: Private residence Living Arrangements: Alone Available Help at Discharge: Family ([place], but they live in [place], and [place]) Type of Home: House Home Access: Stairs to enter Entrance Stairs-Rails: Right Entrance Stairs-Number of Steps: 3 Alternate Level Stairs-Number of Steps: 14 Home Layout: Two level;Full bath on main level;Able to live on main level with bedroom/bathroom (Pt reports he very rarely goes up to the second floor.) Home Equipment: Cane - single point      Prior Function Prior Level of Function : Independent/Modified Independent             Mobility Comments: Pt reports 10 falls in the past week, with most of them occurring at nighttime.       Hand Dominance   Dominant Hand: Right    Extremity/Trunk Assessment   Upper Extremity Assessment Upper Extremity Assessment: Generalized weakness    Lower Extremity Assessment Lower Extremity Assessment: Generalized weakness    Cervical / Trunk Assessment Cervical / Trunk Assessment: Kyphotic  Communication   Communication: No difficulties  Cognition Arousal/Alertness: Awake/alert Behavior During Therapy: WFL for tasks assessed/performed Overall Cognitive Status: Within Functional Limits for tasks assessed                                 General Comments: A&O x4, stated it was 1923 at first, but corrected himself.        General Comments      Exercises Other Exercises Other Exercises: Pt educated on role of PT and services provided during hospital stay.   Assessment/Plan    PT Assessment Patient needs continued PT services  PT Problem List  Decreased strength;Decreased range of motion;Decreased balance;Decreased safety awareness;Decreased knowledge of use of DME       PT Treatment Interventions DME instruction;Gait training;Stair training;Functional mobility training;Therapeutic activities;Therapeutic exercise;Balance training;Neuromuscular re-education    PT Goals (Current goals can be found in the Care Plan section)  Acute Rehab PT Goals Patient Stated Goal: to get stronger and get meds under control so he doesn't fall as much. PT Goal Formulation: With patient Time For Goal Achievement: 06/05/21 Potential to Achieve Goals: Good    Frequency Min 2X/week     Co-evaluation               AM-PAC PT "6 Clicks" Mobility  Outcome Measure Help needed turning from your back to your side while in a flat bed without using bedrails?: None Help needed moving from lying on your back to sitting on the side of a flat bed without using bedrails?: None Help needed moving to and from a bed to a chair (including a wheelchair)?: A Little Help needed standing up from  a chair using your arms (e.g., wheelchair or bedside chair)?: A Little Help needed to walk in hospital room?: A Little Help needed climbing 3-5 steps with a railing? : A Lot 6 Click Score: 19    End of Session Equipment Utilized During Treatment: Gait belt Activity Tolerance: Patient tolerated treatment well Patient left: in bed;with call bell/phone within reach;with nursing/sitter in room Nurse Communication: Mobility status PT Visit Diagnosis: Unsteadiness on feet (R26.81);Other abnormalities of gait and mobility (R26.89);Repeated falls (R29.6);Muscle weakness (generalized) (M62.81);History of falling (Z91.81);Difficulty in walking, not elsewhere classified (R26.2)    Time: 4171-2787 PT Time Calculation (min) (ACUTE ONLY): 58 min   Charges:   PT Evaluation $PT Eval Low Complexity: 1 Low PT Treatments $Gait Training: 23-37 mins $Therapeutic Activity: 8-22  mins        Gwenlyn Saran, PT, DPT 05/22/21, 1:06 PM   Christie Nottingham 05/22/2021, 1:06 PM

## 2021-05-22 NOTE — Assessment & Plan Note (Signed)
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  Followed by oncology for adenocarcinoma.  --Ostomy care --Monitor

## 2021-05-22 NOTE — Progress Notes (Addendum)
Mobility Specialist - Progress Note   05/22/21 1600  Mobility  Activity Ambulated with assistance in hallway  Level of Assistance Standby assist, set-up cues, supervision of patient - no hands on  Assistive Device None  Distance Ambulated (ft) 40 ft  Activity Response Tolerated well  $Mobility charge 1 Mobility    Pt found ambulating in hallway with wet socks, no AD, and IV pulled out. LOB x 1 corrected by CGA. Pt assisted back to room and RN notified and entered to address issue. Pt still adamantly refusing bed alarm. Requires redirection to tasks at hand---pt focused on adjusting blinds while ambulating to bed. Does pick up RW when navigating tight spaces; VC for side-stepping. Still AO however. Able to doff socks in figure 4 position once seated. Pt left EOB with NT present.   Kathee Delton Mobility Specialist 05/22/21, 4:24 PM

## 2021-05-22 NOTE — ED Notes (Signed)
NP [place] updated on Pt's BP: noted better when bolus was given, after fluid bolus was done  BP went back to pre bolus readings

## 2021-05-22 NOTE — Progress Notes (Signed)
[facility]  ROUNDING NOTE   Subjective:   Manuel Cummings is a 76 y.o. male with past medical history of anxiety, hypertension, and chronic kidney disease stage IIIb.  Patient presents to the emergency department with complaints of frequent falls and weakness.  Patient has been admitted for Acute kidney injury [facility]) [N17.9]   Patient is known to our practice and is followed outpatient by Dr. Holley Raring.  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  He was placed in a rehab after surgery and has been discharged from rehab for 1 week.  During this week he has sustained multiple falls, complaining of weakness and dizziness.  Does report poor oral intake but drinks ensures.  Complains of insomnia despite multiple sedate of medications ordered at night.    1/25-today patient looks and feels better.  Urine output has improved.  Serum creatinine has improved down to 3.07.  Potassium is also now back in normal range.  Bicarbonate level is up to 24.  No shortness of breath or leg edema   Objective:  Vital signs in last 24 hours:  Temp:  [98 F (36.7 C)-98.1 F (36.7 C)] 98 F (36.7 C) (01/25 0927) Pulse Rate:  [72-88] 86 (01/25 0927) Resp:  [11-20] 18 (01/25 0927) BP: (66-129)/(35-61) 125/55 (01/25 0927) SpO2:  [91 %-100 %] 100 % (01/25 0927)  Weight change:  Filed Weights   05/21/21 1024  Weight: 72.6 kg    Intake/Output: I/O last 3 completed shifts: In: 2250 [IV Piggyback:2250] Out: 1450 [Urine:950; Stool:500]   Intake/Output this shift:  Total I/O In: -  Out: 800 [Urine:800]  Physical Exam: General: NAD, resting in bed, anxious  Head: Normocephalic, atraumatic.  Moist oral mucosal membranes  Eyes: Anicteric  Lungs:  Clear to auscultation, normal effort  Heart: Regular rate and rhythm   Abdomen:  Soft, nontender, nondistended  Extremities: No peripheral edema.  Neurologic: Nonfocal, moving all four extremities  Skin: Abrasions on nose, poor skin turgor  Dialysis Access: None    Basic Metabolic Panel: Recent Labs  Lab 05/21/21 1021 05/21/21 1838 05/22/21 0459  NA 128* 130* 133*  K 7.3* 5.5* 4.7  CL 98 98 101  CO2 17* 20* 24  GLUCOSE 139* 123* 109*  BUN 132* 115* 94*  CREATININE 4.36* 3.84* 3.07*  CALCIUM 8.6* 8.0* 7.2*     Liver Function Tests: Recent Labs  Lab 05/21/21 1021  AST 21  ALT 14  ALKPHOS 64  BILITOT 0.5  PROT 7.0  ALBUMIN 3.6    No results for input(s): LIPASE, AMYLASE in the last 168 hours. No results for input(s): AMMONIA in the last 168 hours.  CBC: Recent Labs  Lab 05/21/21 1021 05/22/21 0459  WBC 7.1 3.5*  NEUTROABS 5.9  --   HGB 9.9* 7.7*  HCT 30.5* 23.1*  MCV 94.1 92.8  PLT 516* 313     Cardiac Enzymes: Recent Labs  Lab 05/21/21 1838  CKTOTAL 506*    BNP: Invalid input(s): POCBNP  CBG: No results for input(s): GLUCAP in the last 168 hours.  Microbiology: Results for orders placed or performed during the hospital encounter of 05/21/21  Resp Panel by RT-PCR (Flu A&B, Covid) Nasopharyngeal Swab     Status: None   Collection Time: 05/21/21 12:48 PM   Specimen: Nasopharyngeal Swab; Nasopharyngeal(NP) swabs in vial transport  medium  Result Value Ref Range Status   SARS Coronavirus 2 by RT PCR NEGATIVE NEGATIVE Final    Comment: (NOTE) SARS-CoV-2 target nucleic acids are NOT DETECTED.  The SARS-CoV-2 RNA is generally detectable in upper respiratory specimens during the acute phase of infection. The lowest concentration of SARS-CoV-2 viral copies this assay can detect is 138 copies/mL. A negative result does not preclude SARS-Cov-2 infection and should not be used as the sole basis for treatment or other patient management decisions. A negative result may occur with  improper specimen collection/handling,  submission of specimen other than nasopharyngeal swab, presence of viral mutation(s) within the areas targeted by this assay, and inadequate number of viral copies(<138 copies/mL). A negative result must be combined with clinical observations, patient history, and epidemiological information. The expected result is Negative.  Fact Sheet for Patients:  EntrepreneurPulse.com.au  Fact Sheet for Healthcare Providers:  IncredibleEmployment.be  This test is no t yet approved or cleared by the [place] FDA and  has been authorized for detection and/or diagnosis of SARS-CoV-2 by FDA under an Emergency Use Authorization (EUA). This EUA will remain  in effect (meaning this test can be used) for the duration of the COVID-19 declaration under Section 564(b)(1) of the Act, 21 U.S.C.section 360bbb-3(b)(1), unless the authorization is terminated  or revoked sooner.       Influenza A by PCR NEGATIVE NEGATIVE Final   Influenza B by PCR NEGATIVE NEGATIVE Final    Comment: (NOTE) The Xpert Xpress SARS-CoV-2/FLU/RSV plus assay is intended as an aid in the diagnosis of influenza from Nasopharyngeal swab specimens and should not be used as a sole basis for treatment. Nasal washings and aspirates are unacceptable for Xpert Xpress SARS-CoV-2/FLU/RSV testing.  Fact Sheet for Patients: EntrepreneurPulse.com.au  Fact Sheet for Healthcare Providers: IncredibleEmployment.be  This test is not yet approved or cleared by the [place] FDA and has been authorized for detection and/or diagnosis of SARS-CoV-2 by FDA under an Emergency Use Authorization (EUA). This EUA will remain in effect (meaning this test can be used) for the duration of the COVID-19 declaration under Section 564(b)(1) of the Act, 21 U.S.C. section 360bbb-3(b)(1), unless the authorization is terminated or revoked.  Performed at [facility], [address] [place], [place] [address]     Coagulation Studies: Recent Labs    05/21/21 1021  LABPROT 13.7  INR 1.1     Urinalysis: Recent Labs    05/21/21 1044  COLORURINE YELLOW*  LABSPEC 1.015  PHURINE 5.0  GLUCOSEU NEGATIVE  HGBUR NEGATIVE  BILIRUBINUR NEGATIVE  KETONESUR NEGATIVE  PROTEINUR NEGATIVE  NITRITE NEGATIVE  LEUKOCYTESUR NEGATIVE       Imaging: CT HEAD WO CONTRAST (5MM)  Result Date: 05/21/2021 CLINICAL DATA:  Multiple falls.  Weakness.  Loss of balance. EXAM: CT HEAD WITHOUT CONTRAST CT CERVICAL SPINE WITHOUT CONTRAST TECHNIQUE: Multidetector CT imaging of the head and cervical spine was performed following the standard protocol without intravenous contrast. Multiplanar CT image reconstructions of the cervical spine were also generated. RADIATION DOSE REDUCTION: This exam was performed according to the departmental dose-optimization program which includes automated exposure control, adjustment of the mA and/or kV according to patient size and/or use of iterative reconstruction technique. COMPARISON:  None. FINDINGS: CT HEAD FINDINGS Brain: Dilated perivascular spaces below the lentiform nuclei. The brainstem, cerebellum, cerebral peduncles, thalami, basal ganglia, basilar cisterns, and ventricular system appear within normal limits. No intracranial hemorrhage, mass lesion, or acute CVA. Vascular: Unremarkable Skull: Unremarkable Sinuses/Orbits: Mild chronic  right maxillary and ethmoid sinusitis. Other: No supplemental non-categorized findings. CT CERVICAL SPINE FINDINGS Alignment: 1.5 mm degenerative posterior subluxation at C5-6. Skull base and vertebrae: No fracture or acute bony findings. Soft tissues and spinal canal: Incidental gas in the left internal jugular system, possibly from IV introduction, typically this is inconsequential in the absence of a right to left cardiac shown. Bilateral common carotid atherosclerotic calcification. Disc levels: C2-3: Mild  left foraminal stenosis due to facet and uncinate spurring. C3-4: Mild left foraminal stenosis and mild central narrowing of the thecal sac due to facet and uncinate spurring, disc bulge, and a small central disc protrusion. C4-5: Mild left borderline right foraminal stenosis due to facet and uncinate spurring along with a mild disc bulge. C5-6: Moderate to prominent right foraminal stenosis due to facet and uncinate spurring. C6-7: Unremarkable. C7-T1: Unremarkable. Upper chest: Biapical pleuroparenchymal scarring. Other: No supplemental non-categorized findings. IMPRESSION: 1. No acute intracranial findings or acute cervical spine findings. 2. 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 3. Cervical spondylosis and degenerative disc disease leading to moderate to prominent impingement at C5-6 and mild impingement at C2-3, C3-4, and C4-5. 4. Bilateral common carotid atherosclerotic calcification. Electronically Signed   By: Van Clines M.D.   On: 05/21/2021 11:24   CT Cervical Spine Wo Contrast  Result Date: 05/21/2021 CLINICAL DATA:  Multiple falls.  Weakness.  Loss of balance. EXAM: CT HEAD WITHOUT CONTRAST CT CERVICAL SPINE WITHOUT CONTRAST TECHNIQUE: Multidetector CT imaging of the head and cervical spine was performed following the standard protocol without intravenous contrast. Multiplanar CT image reconstructions of the cervical spine were also generated. RADIATION DOSE REDUCTION: This exam was performed according to the departmental dose-optimization program which includes automated exposure control, adjustment of the mA and/or kV according to patient size and/or use of iterative reconstruction technique. COMPARISON:  None. FINDINGS: CT HEAD FINDINGS Brain: Dilated perivascular spaces below the lentiform nuclei. The brainstem, cerebellum, cerebral peduncles, thalami, basal ganglia, basilar cisterns, and ventricular system appear within normal limits. No intracranial hemorrhage, mass  lesion, or acute CVA. Vascular: Unremarkable Skull: Unremarkable Sinuses/Orbits: Mild chronic right maxillary and ethmoid sinusitis. Other: No supplemental non-categorized findings. CT CERVICAL SPINE FINDINGS Alignment: 1.5 mm degenerative posterior subluxation at C5-6. Skull base and vertebrae: No fracture or acute bony findings. Soft tissues and spinal canal: Incidental gas in the left internal jugular system, possibly from IV introduction, typically this is inconsequential in the absence of a right to left cardiac shown. Bilateral common carotid atherosclerotic calcification. Disc levels: C2-3: Mild left foraminal stenosis due to facet and uncinate spurring. C3-4: Mild left foraminal stenosis and mild central narrowing of the thecal sac due to facet and uncinate spurring, disc bulge, and a small central disc protrusion. C4-5: Mild left borderline right foraminal stenosis due to facet and uncinate spurring along with a mild disc bulge. C5-6: Moderate to prominent right foraminal stenosis due to facet and uncinate spurring. C6-7: Unremarkable. C7-T1: Unremarkable. Upper chest: Biapical pleuroparenchymal scarring. Other: No supplemental non-categorized findings. IMPRESSION: 1. No acute intracranial findings or acute cervical spine findings. 2. 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 3. Cervical spondylosis and degenerative disc disease leading to moderate to prominent impingement at C5-6 and mild impingement at C2-3, C3-4, and C4-5. 4. Bilateral common carotid atherosclerotic calcification. Electronically Signed   By: Van Clines M.D.   On: 05/21/2021 11:24   US RENAL  Result Date: 05/21/2021 CLINICAL DATA:  Acute kidney injury EXAM: RENAL / URINARY TRACT ULTRASOUND COMPLETE  COMPARISON:  None. FINDINGS: Right Kidney: Renal measurements: 8.7 x 3.8 x 3.4 cm = volume: 59 mL. Echogenicity within normal limits. No mass or hydronephrosis visualized. Left Kidney: Renal measurements: 9.8 x 4.8 x  4.3 cm = volume: 106 mL. Echogenicity within normal limits. No mass or hydronephrosis visualized. Bladder: Appears normal for degree of bladder distention. Other: None. IMPRESSION: Small right kidney.  No obstruction or mass lesion. Electronically Signed   By: Franchot Gallo M.D.   On: 05/21/2021 17:13   DG Chest Port 1 View  Result Date: 05/21/2021 CLINICAL DATA:  Questionable sepsis, multiple falls and increased weakness over past few days, hypertension EXAM: PORTABLE CHEST 1 VIEW COMPARISON:  Portable exam 1053 hours compared to 01/15/2021 FINDINGS: Normal heart size, mediastinal contours, and pulmonary vascularity. Lungs clear. No pulmonary infiltrate, pleural effusion, or pneumothorax. No fractures. IMPRESSION: No acute abnormalities. Electronically Signed   By: Lavonia Dana M.D.   On: 05/21/2021 11:55     Medications:    pantoprazole 8 mg/hr (05/22/21 2248)    sodium bicarbonate (isotonic) infusion in sterile water 125 mL/hr at 05/22/21 2500    ascorbic acid  500 mg Oral Daily   cholecalciferol  5,000 Units Oral Daily   ferrous sulfate  325 mg Oral q AM   melatonin  2.5 mg Oral QHS   [START ON 05/25/2021] pantoprazole  40 mg Intravenous Q12H   QUEtiapine  200 mg Oral QHS   sodium zirconium cyclosilicate  10 g Oral Daily   tamsulosin  0.4 mg Oral Daily   traZODone  100 mg Oral QHS   triazolam  0.25 mg Oral QHS   fentaNYL (SUBLIMAZE) injection, LORazepam, ondansetron **OR** ondansetron (ZOFRAN) IV, traMADol  Assessment/ Plan:  Mr. Manuel Cummings is a 76 y.o.  male ith past medical history of anxiety, hypertension, and chronic kidney disease stage IIIb.  Patient presents to the emergency department with complaints of frequent falls and weakness.  Patient has been admitted for Acute kidney injury [facility]) [N17.9]   Acute Kidney Injury on chronic kidney disease stage IIIb with baseline creatinine 1.75 and GFR of 40 on 04/05/21.  Acute kidney injury secondary to dehydration, patient appears  hypovolemic Renal ultrasound -no obstruction  lisinopril held Avoid nephrotoxic agents and therapies.  Lab Results  Component Value Date   CREATININE 3.07 (H) 05/22/2021   CREATININE 3.84 (H) 05/21/2021   CREATININE 4.36 (H) 05/21/2021    Intake/Output Summary (Last 24 hours) at 05/22/2021 1033 Last data filed at 05/22/2021 0800 Gross per 24 hour  Intake 2250 ml  Output 2250 ml  Net 0 ml    Continue with IV hydration for another day. Will change IV fluids to lactated Ringer now that bicarb level is corrected.  2.  Severe hyperkalemia Corrected with IV bicarb infusion and Lokelma.   3.  Hypertension with chronic kidney disease.  Home regimen includes lisinopril, amlodipine, and amitriptyline.  All currently held at this time.  BP Readings from Last 1 Encounters:  05/22/21 (!) 125/55       LOS: 1 Merlyn Bollen Candiss Norse 1/25/202310:33 AM

## 2021-05-23 ENCOUNTER — Inpatient Hospital Stay: Payer: Medicare Other

## 2021-05-23 DIAGNOSIS — R195 Other fecal abnormalities: Secondary | ICD-10-CM | POA: Diagnosis present

## 2021-05-23 DIAGNOSIS — I951 Orthostatic hypotension: Secondary | ICD-10-CM | POA: Clinically undetermined

## 2021-05-23 DIAGNOSIS — F418 Other specified anxiety disorders: Secondary | ICD-10-CM | POA: Diagnosis present

## 2021-05-23 DIAGNOSIS — G4700 Insomnia, unspecified: Secondary | ICD-10-CM | POA: Diagnosis present

## 2021-05-23 DIAGNOSIS — N179 Acute kidney failure, unspecified: Secondary | ICD-10-CM | POA: Diagnosis not present

## 2021-05-23 LAB — CBC
HCT: 25.7 % — ABNORMAL LOW (ref 39.0–52.0)
Hemoglobin: 8.4 g/dL — ABNORMAL LOW (ref 13.0–17.0)
MCH: 30.5 pg (ref 26.0–34.0)
MCHC: 32.7 g/dL (ref 30.0–36.0)
MCV: 93.5 fL (ref 80.0–100.0)
Platelets: 310 10*3/uL (ref 150–400)
RBC: 2.75 MIL/uL — ABNORMAL LOW (ref 4.22–5.81)
RDW: 13.7 % (ref 11.5–15.5)
WBC: 4.7 10*3/uL (ref 4.0–10.5)
nRBC: 0 % (ref 0.0–0.2)

## 2021-05-23 LAB — BASIC METABOLIC PANEL
Anion gap: 6 (ref 5–15)
BUN: 76 mg/dL — ABNORMAL HIGH (ref 8–23)
CO2: 27 mmol/L (ref 22–32)
Calcium: 7.5 mg/dL — ABNORMAL LOW (ref 8.9–10.3)
Chloride: 102 mmol/L (ref 98–111)
Creatinine, Ser: 2.34 mg/dL — ABNORMAL HIGH (ref 0.61–1.24)
GFR, Estimated: 28 mL/min — ABNORMAL LOW (ref >60)
Glucose, Bld: 111 mg/dL — ABNORMAL HIGH (ref 70–99)
Potassium: 4.7 mmol/L (ref 3.5–5.1)
Sodium: 135 mmol/L (ref 135–145)

## 2021-05-23 LAB — PHOSPHORUS: Phosphorus: 4.3 mg/dL (ref 2.5–4.6)

## 2021-05-23 LAB — MAGNESIUM: Magnesium: 2.5 mg/dL — ABNORMAL HIGH (ref 1.7–2.4)

## 2021-05-23 LAB — OCCULT BLOOD X 1 CARD TO LAB, STOOL: Fecal Occult Bld: POSITIVE — AB

## 2021-05-23 MED ORDER — MIDODRINE HCL 5 MG PO TABS
5.0000 mg | ORAL_TABLET | Freq: Three times a day (TID) | ORAL | Status: DC
  Administered 2021-05-23 – 2021-05-24 (×3): 5 mg via ORAL
  Filled 2021-05-23 (×3): qty 1

## 2021-05-23 NOTE — Progress Notes (Addendum)
° °      CROSS COVER NOTE  NAME: Manuel Cummings MRN: [id number] DOB : Sep 19, 1945   Notified by nursing that patient has fallen out of bed.  No loss of consciousness or change in sensorium that precipitated the fall.  Patient is not complaining of any pain and there is no visible sign of new injury on assessment (patient does have scabs and bruising that are beginning to heal). The fall was unwitnessed and nursing reports patient hit his head. CT Head ordered (negative for acute intracranial findings or skull fractures). We will continue to monitor and consider additional testing if patient begins to exhibit any symptoms of  pain or confusion.   Neomia Glass MHA, MSN, FNP-BC Nurse Practitioner [facility] Pager [phone]

## 2021-05-23 NOTE — Assessment & Plan Note (Signed)
Continue trazodone, Seroquel, melatonin, triazolam

## 2021-05-23 NOTE — Assessment & Plan Note (Signed)
Patient is on oral iron supplement which can cause false positive.  Hemoglobin stable and actually improved from yesterday to today.  Continue to monitor.  Stop PPI.  Very low suspicion for active GI bleeding.

## 2021-05-23 NOTE — Assessment & Plan Note (Addendum)
Patient's been having recurrent falls, likely related to orthostatic blood pressure.  On chart review appears his BP has been soft recently and they were scaling back antihypertensives. 1/26: Positive orthostatic vital signs with drop in BP from 132/57 down to 82/54. -- Hold antihypertensives -- Given IV fluids for AKI -- Continue midodrine 5 mg 3 times daily WC -- Recheck orthostatic vitals in follow up

## 2021-05-23 NOTE — Assessment & Plan Note (Signed)
Patient is followed outpatient by psychiatry. Continue home medications. Outpatient follow-up as scheduled.

## 2021-05-23 NOTE — Progress Notes (Signed)
Pt noncompliant with fall precautions and had previously been refusing bed alarm. Per day RN, Katha Cabal, Dr. Arbutus Ped aware and patient was moved to room across from nursing station. This RN at beginning of shift spent 1 hr 15 minutes with patient discussing his weakness, falls and need for bed alarm. Pt Aox4 and very insistent on refusing bed alarm. RN educated that he would most likely fall if he attempted to get up without assistance to which the patient replied "I know". Informed pt to please call for assistance. While this RN was in the room he was assisted twice to stand bedside for ileostomy and urinal assistance. Pt is unsteady on feet and weak. Pt compliant asking for help when RN in room. New IV placed as pt had previously pulled out 2 IV's during the day. Pt reminded multiple times to call for assistance. Pt verbalized understanding.    Pt attempted to get out of bed @0330  and fell. Pt did not call for assistance. Pt found down on floor as thud was heard. VSS, pt Aox4. MD informed. Pt denies pain.There was no LOC.  CT head ordered and performed. Pt informed d/t fall, bed alarm was to be activated. Pt refused. Pt informed that he had already fallen and was a safety risk. Bed alarm turned on.

## 2021-05-23 NOTE — Progress Notes (Signed)
Physical Therapy Treatment Patient Details Name: Manuel Cummings MRN: [id number] DOB: 09-16-1945 Today's Date: 05/23/2021   History of Present Illness Manuel Cummings is a 76 y.o. male with past medical history of anxiety, hypertension, and chronic kidney disease stage IIIb.  Patient presents to the emergency department with complaints of frequent falls and weakness.    PT Comments    Pt received in supine position and agreeable to therapy.  Pt able to ambulate around the nursing station x10 with good technique.  Pt with good stability with RW and would benefit from having AD at home and HHPT to assist with training around his home with new walker.  POC notified of need for walker.  Current discharge plans to home with HHPT remain appropriate at this time.  Pt will continue to benefit from skilled therapy in order to address deficits listed below.     Recommendations for follow up therapy are one component of a multi-disciplinary discharge planning process, led by the attending physician.  Recommendations may be updated based on patient status, additional functional criteria and insurance authorization.  Follow Up Recommendations  Home health PT     Assistance Recommended at Discharge PRN  Patient can return home with the following     Equipment Recommendations  Rolling walker (2 wheels)    Recommendations for Other Services       Precautions / Restrictions Precautions Precautions: None Restrictions Weight Bearing Restrictions: No     Mobility  Bed Mobility Overal bed mobility: Modified Independent                  Transfers Overall transfer level: Needs assistance Equipment used: Standard walker Transfers: Sit to/from Stand Sit to Stand: Min guard           General transfer comment: pt able to come upright from lowered hospital bed.    Ambulation/Gait Ambulation/Gait assistance: Min guard Gait Distance (Feet): 1600 Feet Assistive device: Rolling walker  (2 wheels) Gait Pattern/deviations: WFL(Within Functional Limits) Gait velocity: decreased     General Gait Details: Pt ambulates well with RW, but has decreased safety awareness.   Stairs             Wheelchair Mobility    Modified Rankin (Stroke Patients Only)       Balance Overall balance assessment: Needs assistance Sitting-balance support: Feet supported, No upper extremity supported Sitting balance-Leahy Scale: Good     Standing balance support: No upper extremity supported, Reliant on assistive device for balance, During functional activity Standing balance-Leahy Scale: Poor Standing balance comment: Pt with good balance supported through UE's on walker.                            Cognition Arousal/Alertness: Awake/alert Behavior During Therapy: WFL for tasks assessed/performed Overall Cognitive Status: Within Functional Limits for tasks assessed                                 General Comments: pt perseverating on not being able to get out opf bed without the alarm or someone in the room with him.        Exercises      General Comments        Pertinent Vitals/Pain Pain Assessment Pain Assessment: No/denies pain    Home Living  Prior Function            PT Goals (current goals can now be found in the care plan section) Acute Rehab PT Goals Patient Stated Goal: to get stronger and get meds under control so he doesn't fall as much. PT Goal Formulation: With patient Time For Goal Achievement: 06/05/21 Potential to Achieve Goals: Good Progress towards PT goals: Progressing toward goals    Frequency    Min 2X/week      PT Plan Current plan remains appropriate    Co-evaluation              AM-PAC PT "6 Clicks" Mobility   Outcome Measure  Help needed turning from your back to your side while in a flat bed without using bedrails?: None Help needed moving from lying on  your back to sitting on the side of a flat bed without using bedrails?: None Help needed moving to and from a bed to a chair (including a wheelchair)?: A Little Help needed standing up from a chair using your arms (e.g., wheelchair or bedside chair)?: A Little Help needed to walk in hospital room?: A Little Help needed climbing 3-5 steps with a railing? : A Lot 6 Click Score: 19    End of Session Equipment Utilized During Treatment: Gait belt Activity Tolerance: Patient tolerated treatment well Patient left: in bed;with call bell/phone within reach;with nursing/sitter in room;with bed alarm set Nurse Communication: Mobility status PT Visit Diagnosis: Unsteadiness on feet (R26.81);Other abnormalities of gait and mobility (R26.89);Repeated falls (R29.6);Muscle weakness (generalized) (M62.81);History of falling (Z91.81);Difficulty in walking, not elsewhere classified (R26.2)     Time: 6503-5465 PT Time Calculation (min) (ACUTE ONLY): 43 min  Charges:  $Gait Training: 38-52 mins                     Gwenlyn Saran, PT, DPT 05/23/21, 5:25 PM    Christie Nottingham 05/23/2021, 5:23 PM

## 2021-05-23 NOTE — Plan of Care (Signed)

## 2021-05-23 NOTE — Progress Notes (Addendum)
Mobility Specialist - Progress Note   05/23/21 1400  Mobility  Activity Ambulated with assistance in hallway;Dangled on edge of bed  Level of Assistance Standby assist, set-up cues, supervision of patient - no hands on  Assistive Device Front wheel walker  Distance Ambulated (ft) 480 ft  Activity Response Tolerated well  $Mobility charge 1 Mobility    Pt lying in bed  upon arrival, utilizing RA. Motivated for OOB. Pleasant, but still upset about bed alarm. Pt sat EOB and emptied ostomy bag prior to ambulation. Denies dizziness throughout session. Pt ambulated in hall with RW, mildly unsteady and impulsive. Cues for safety awareness. Pt returned to bed with alarm set, needs in reach. Pt voices desire to regain full independence.   Manuel Cummings Mobility Specialist 05/23/21, 3:00 PM

## 2021-05-23 NOTE — Progress Notes (Signed)
Progress Note   Patient: Manuel Cummings WLN:[id number] DOB: [birth year] DOA: 05/21/2021     2 DOS: the patient was seen and examined on 05/23/2021   Brief hospital course:  76 y.o. male with past medical history of anxiety, hypertension, and chronic kidney disease stage IIIb.  Patient presents to the emergency department with complaints of frequent falls and weakness.    Admitted for Acute kidney injury with creatinine on admission 4.36, BUN 132.  Assessment and Plan * AKI (acute kidney injury) ([place])- (present on admission) POA with Cr 4.36, improving with IV hydration.  Baseline Cr 1.75 on 04/25/21.   No obstruction on renal U/S. 1/26: Renal function improving, creatinine 2.34 today. Avoid nephrotoxins and hypotension. Continue IV fluids Nephrology following, see their recs  CKD (chronic kidney disease), stage III ([place])- (present on admission) Baseline Cr 1.75 in December. Currently with AKI.  Orthostatic hypotension Patient's been having recurrent falls, likely related to orthostatic blood pressure.  On chart review appears his BP has been soft recently and they were scaling back antihypertensives. 1/26: Positive orthostatic vital signs with drop in BP from 132/57 down to 82/54. -- Hold antihypertensives -- On IV fluids for AKI -- Try on midodrine 5 mg 3 times daily WC -- Recheck orthostatic vitals daily  Falls- (present on admission) Pt had just returned home about a week ago after rehab stay following his hemicolectomy.  Had multiple falls at home, pt attributes to his sleep medication.  Likely related to uremia and electrolyte derangements. 1/26: Patient found to have significant orthostatic hypotension likely contributing.  Overnight last night, patient was noncompliant with fall precautions and attempted to get out of bed on his own resulting in a fall.  Head CT was negative and patient denies any injuries as a result. --PT/OT consults --Fall precautions  Physical  deconditioning- (present on admission) PT / OT evaluations Fall precautions.  Heme positive stool- (present on admission) Patient is on oral iron supplement which can cause false positive.  Hemoglobin stable and actually improved from yesterday to today.  Continue to monitor.  Stop PPI.  Very low suspicion for active GI bleeding.  Hyperkalemia- (present on admission) Resolved.  POA with K 7.3. --Monitor BMP  Hyponatremia- (present on admission) POA with Na 128. Improving with IV fluids. Monitor BMP.  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  Followed by oncology for adenocarcinoma.  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 Continue home medications. Outpatient follow-up as scheduled.   Insomnia- (present on admission) Continue trazodone, Seroquel, melatonin, triazolam     Subjective: Patient awake sitting up in bed when seen today.  He asks when he will be able to go home.  He perseverates on having the freedom to get up out of bed at [place] without having to call for help.  We discussed his fall overnight resulting from lack of assistance in getting up, patient asks if we will just trust him.  He offers no acute complaints other than wanting to get out of bed freely and to go home soon.  Objective Vitals reviewed and notable for positive orthostatic blood pressures with a drop from 132/57 down to 182/54.  No fevers, no hypoxia, heart rates controlled  General exam: awake, alert, no acute distress HEENT: Healing abrasion on bridge of nose, moist mucus membranes, hearing  grossly normal  Respiratory system: normal respiratory effort, on room air. Cardiovascular system: normal S1/S2, RRR,   Central nervous system: A&O x3. no gross focal neurologic  deficits, normal speech Extremities: moves all, no edema, normal tone Skin: dry, intact, normal temperature Psychiatry: normal mood, congruent affect, judgement and insight appear abnormal   Data Reviewed:  Labs reviewed and notable for glucose 111, BUN 76, creatinine 2.34, calcium 7.5, magnesium 2.5, hemoglobin 8.4.  Noncontrast head CT negative for acute findings including bleeding or skull fracture.  Family Communication: None at bedside on rounds  Disposition: Status is: Inpatient  Remains inpatient appropriate because: Severity of illness with ongoing AKI on IV fluids         Time spent: 35 minutes  Author: Ezekiel Slocumb, DO 05/23/2021 3:00 PM  For on call review [email or web address].

## 2021-05-23 NOTE — Progress Notes (Signed)
[facility]  ROUNDING NOTE   Subjective:   Manuel Cummings is a 76 y.o. male with past medical history of anxiety, hypertension, and chronic kidney disease stage IIIb.  Patient presents to the emergency department with complaints of frequent falls and weakness.  Patient has been admitted for Hyperkalemia [E87.5] AKI (acute kidney injury) ([place]) [N17.9] Acute kidney injury ([place]) [N17.9]   Patient is known to our practice and is followed outpatient by Dr. Holley Raring.  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    Patient seen sitting up in bed, alert but confused Oriented to self only Denies nausea, vomiting, and shortness of breath Fixated on using urinal   Objective:  Vital signs in last 24 hours:  Temp:  [98.1 F (36.7 C)-98.6 F (37 C)] 98.6 F (37 C) (01/26 1148) Pulse Rate:  [84-92] 89 (01/26 1154) Resp:  [17-20] 18 (01/26 1148) BP: (82-135)/(41-58) 98/58 (01/26 1154) SpO2:  [98 %-99 %] 98 % (01/26 1148)  Weight change:  Filed Weights   05/21/21 1024  Weight: 72.6 kg    Intake/Output: I/O last 3 completed shifts: In: 4493.8 [P.O.:420; I.V.:3073.8; IV Piggyback:1000] Out: 2675 [Urine:1925; Stool:750]   Intake/Output this shift:  Total I/O In: 671.5 [I.V.:671.5] Out: 1075 [Urine:675; Stool:400]  Physical Exam: General: NAD, resting in bed  Head: Normocephalic, atraumatic.  Moist oral mucosal membranes  Eyes: Anicteric  Lungs:  Clear to auscultation, normal effort  Heart: Regular rate and rhythm  Abdomen:  Soft, nontender, nondistended  Extremities: No peripheral edema.  Neurologic: Alert oriented to self   Skin: Abrasions on nose, poor skin turgor  Dialysis Access: None    Basic Metabolic Panel: Recent Labs  Lab 05/21/21 1021 05/21/21 1838 05/22/21 0459 05/23/21 0542  NA 128* 130* 133* 135  K 7.3* 5.5* 4.7 4.7  CL 98 98  101 102  CO2 17* 20* 24 27  GLUCOSE 139* 123* 109* 111*  BUN 132* 115* 94* 76*  CREATININE 4.36* 3.84* 3.07* 2.34*  CALCIUM 8.6* 8.0* 7.2* 7.5*  MG  --   --   --  2.5*  PHOS  --   --   --  4.3     Liver Function Tests: Recent Labs  Lab 05/21/21 1021  AST 21  ALT 14  ALKPHOS 64  BILITOT 0.5  PROT 7.0  ALBUMIN 3.6    No results for input(s): LIPASE, AMYLASE in the last 168 hours. No results for input(s): AMMONIA in the last 168 hours.  CBC: Recent Labs  Lab 05/21/21 1021 05/22/21 0459 05/23/21 0542  WBC 7.1 3.5* 4.7  NEUTROABS 5.9  --   --   HGB 9.9* 7.7* 8.4*  HCT 30.5* 23.1* 25.7*  MCV 94.1 92.8 93.5  PLT 516* 313 310     Cardiac Enzymes: Recent Labs  Lab 05/21/21 1838  CKTOTAL 506*     BNP: Invalid input(s): POCBNP  CBG: No results for input(s): GLUCAP in the last 168 hours.  Microbiology: Results for orders placed or performed during the hospital encounter of 05/21/21  Resp Panel by RT-PCR (Flu A&B, Covid) Nasopharyngeal Swab     Status: None   Collection Time: 05/21/21 12:48 PM   Specimen: Nasopharyngeal Swab; Nasopharyngeal(NP) swabs in vial transport medium  Result Value Ref Range Status   SARS Coronavirus 2 by RT PCR NEGATIVE NEGATIVE Final    Comment: (NOTE) SARS-CoV-2 target nucleic acids  are NOT DETECTED.  The SARS-CoV-2 RNA is generally detectable in upper respiratory specimens during the acute phase of infection. The lowest concentration of SARS-CoV-2 viral copies this assay can detect is 138 copies/mL. A negative result does not preclude SARS-Cov-2 infection and should not be used as the sole basis for treatment or other patient management decisions. A negative result may occur with  improper specimen collection/handling, submission of specimen other than nasopharyngeal swab, presence of viral mutation(s) within the areas targeted by this assay, and inadequate number of viral copies(<138 copies/mL). A negative result must be  combined with clinical observations, patient history, and epidemiological information. The expected result is Negative.  Fact Sheet for Patients:  EntrepreneurPulse.com.au  Fact Sheet for Healthcare Providers:  IncredibleEmployment.be  This test is no t yet approved or cleared by the [place] FDA and  has been authorized for detection and/or diagnosis of SARS-CoV-2 by FDA under an Emergency Use Authorization (EUA). This EUA will remain  in effect (meaning this test can be used) for the duration of the COVID-19 declaration under Section 564(b)(1) of the Act, 21 U.S.C.section 360bbb-3(b)(1), unless the authorization is terminated  or revoked sooner.       Influenza A by PCR NEGATIVE NEGATIVE Final   Influenza B by PCR NEGATIVE NEGATIVE Final    Comment: (NOTE) The Xpert Xpress SARS-CoV-2/FLU/RSV plus assay is intended as an aid in the diagnosis of influenza from Nasopharyngeal swab specimens and should not be used as a sole basis for treatment. Nasal washings and aspirates are unacceptable for Xpert Xpress SARS-CoV-2/FLU/RSV testing.  Fact Sheet for Patients: EntrepreneurPulse.com.au  Fact Sheet for Healthcare Providers: IncredibleEmployment.be  This test is not yet approved or cleared by the [place] FDA and has been authorized for detection and/or diagnosis of SARS-CoV-2 by FDA under an Emergency Use Authorization (EUA). This EUA will remain in effect (meaning this test can be used) for the duration of the COVID-19 declaration under Section 564(b)(1) of the Act, 21 U.S.C. section 360bbb-3(b)(1), unless the authorization is terminated or revoked.  Performed at [facility], [address] [place], [place] [address]     Coagulation Studies: Recent Labs    05/21/21 1021  LABPROT 13.7  INR 1.1     Urinalysis: Recent Labs    05/21/21 1044  COLORURINE YELLOW*  LABSPEC  1.015  PHURINE 5.0  GLUCOSEU NEGATIVE  HGBUR NEGATIVE  BILIRUBINUR NEGATIVE  KETONESUR NEGATIVE  PROTEINUR NEGATIVE  NITRITE NEGATIVE  LEUKOCYTESUR NEGATIVE       Imaging: CT HEAD WO CONTRAST (5MM)  Result Date: 05/23/2021 CLINICAL DATA:  Head trauma with frequent falls. EXAM: CT HEAD WITHOUT CONTRAST TECHNIQUE: Contiguous axial images were obtained from the base of the skull through the vertex without intravenous contrast. RADIATION DOSE REDUCTION: This exam was performed according to the departmental dose-optimization program which includes automated exposure control, adjustment of the mA and/or kV according to patient size and/or use of iterative reconstruction technique. COMPARISON:  The recent head CT 05/21/2021. FINDINGS: Brain: There is mild cerebral atrophy, small vessel disease and atrophic ventriculomegaly, without midline shift. Prominent perivascular spaces are again noted in the basal ganglia posteriorly. Cerebellum and brainstem are unremarkable. No focal asymmetry is seen worrisome for acute infarct, hemorrhage or mass. The basal cisterns are clear. Vascular: There are calcifications of the carotid siphons but no hyperdense central vessels. Trace calcification distal left vertebral artery. Skull: The calvarium, skull base and orbits are intact, with no visible bone lesion. There is mild swelling in  the left parietal scalp, cutaneous calcifications again noted in the frontal scalp. Sinuses/Orbits: Slight chronic membrane disease is noted posteriorly in the left maxillary sinus, scattered bilateral ethmoid air cells. Other visible sinuses and bilateral mastoid air cells are clear. Other: None. IMPRESSION: No acute intracranial CT findings or depressed skull fractures. Mild swelling in the left parietal scalp is the only appreciable interval change. Sinus disease and additional chronic changes described above. Electronically Signed   By: Telford Nab M.D.   On: 05/23/2021 05:27   US  RENAL  Result Date: 05/21/2021 CLINICAL DATA:  Acute kidney injury EXAM: RENAL / URINARY TRACT ULTRASOUND COMPLETE COMPARISON:  None. FINDINGS: Right Kidney: Renal measurements: 8.7 x 3.8 x 3.4 cm = volume: 59 mL. Echogenicity within normal limits. No mass or hydronephrosis visualized. Left Kidney: Renal measurements: 9.8 x 4.8 x 4.3 cm = volume: 106 mL. Echogenicity within normal limits. No mass or hydronephrosis visualized. Bladder: Appears normal for degree of bladder distention. Other: None. IMPRESSION: Small right kidney.  No obstruction or mass lesion. Electronically Signed   By: Franchot Gallo M.D.   On: 05/21/2021 17:13     Medications:    lactated ringers 75 mL/hr at 05/23/21 0347    ascorbic acid  500 mg Oral Daily   cholecalciferol  5,000 Units Oral Daily   ferrous sulfate  325 mg Oral q AM   melatonin  2.5 mg Oral QHS   midodrine  5 mg Oral TID WC   QUEtiapine  200 mg Oral QHS   tamsulosin  0.4 mg Oral Daily   traZODone  100 mg Oral QHS   triazolam  0.25 mg Oral QHS   fentaNYL (SUBLIMAZE) injection, LORazepam, ondansetron **OR** ondansetron (ZOFRAN) IV, traMADol  Assessment/ Plan:  Mr. ELONZO SOPP is a 76 y.o.  male ith past medical history of anxiety, hypertension, and chronic kidney disease stage IIIb.  Patient presents to the emergency department with complaints of frequent falls and weakness.  Patient has been admitted for Hyperkalemia [E87.5] AKI (acute kidney injury) ([place]) [N17.9] Acute kidney injury ([place]) [N17.9]   Acute Kidney Injury on chronic kidney disease stage IIIb with baseline creatinine 1.75 and GFR of 40 on 04/05/21.  Acute kidney injury secondary to dehydration, patient appears hypovolemic. Renal ultrasound -no obstruction  lisinopril held Avoid nephrotoxic agents and therapies.  renal function has improved with IV fluids, continue at this time  Urine output of 975 mils recorded in 24 hours  We will continue to monitor  Lab Results  Component Value  Date   CREATININE 2.34 (H) 05/23/2021   CREATININE 3.07 (H) 05/22/2021   CREATININE 3.84 (H) 05/21/2021    Intake/Output Summary (Last 24 hours) at 05/23/2021 1628 Last data filed at 05/23/2021 1501 Gross per 24 hour  Intake 2341.63 ml  Output 1500 ml  Net 841.63 ml    Continue with IV hydration    2.  Severe hyperkalemia Corrected with IV bicarb infusion and Lokelma. Potassium currently 4.7  3.  Hypertension with chronic kidney disease.  Home regimen includes lisinopril, amlodipine, and amitriptyline.  All currently held at this time.  BP Readings from Last 1 Encounters:  05/23/21 (!) 98/58       LOS: 2 Colon Flattery 1/26/20234:28 PM

## 2021-05-23 NOTE — Progress Notes (Signed)
RN alerted by physical therapy that patient had turned his own bed alarms off. Patient reeducated that bed alarms must remain on for his safety.

## 2021-05-24 LAB — BASIC METABOLIC PANEL
Anion gap: 6 (ref 5–15)
BUN: 54 mg/dL — ABNORMAL HIGH (ref 8–23)
CO2: 26 mmol/L (ref 22–32)
Calcium: 7.8 mg/dL — ABNORMAL LOW (ref 8.9–10.3)
Chloride: 104 mmol/L (ref 98–111)
Creatinine, Ser: 1.85 mg/dL — ABNORMAL HIGH (ref 0.61–1.24)
GFR, Estimated: 37 mL/min — ABNORMAL LOW (ref >60)
Glucose, Bld: 107 mg/dL — ABNORMAL HIGH (ref 70–99)
Potassium: 4.7 mmol/L (ref 3.5–5.1)
Sodium: 136 mmol/L (ref 135–145)

## 2021-05-24 LAB — CBC
HCT: 22.8 % — ABNORMAL LOW (ref 39.0–52.0)
Hemoglobin: 7.3 g/dL — ABNORMAL LOW (ref 13.0–17.0)
MCH: 30.8 pg (ref 26.0–34.0)
MCHC: 32 g/dL (ref 30.0–36.0)
MCV: 96.2 fL (ref 80.0–100.0)
Platelets: 256 10*3/uL (ref 150–400)
RBC: 2.37 MIL/uL — ABNORMAL LOW (ref 4.22–5.81)
RDW: 13.7 % (ref 11.5–15.5)
WBC: 4.3 10*3/uL (ref 4.0–10.5)
nRBC: 0 % (ref 0.0–0.2)

## 2021-05-24 LAB — IRON AND TIBC
Iron: 16 ug/dL — ABNORMAL LOW (ref 45–182)
Saturation Ratios: 5 % — ABNORMAL LOW (ref 17.9–39.5)
TIBC: 311 ug/dL (ref 250–450)
UIBC: 295 ug/dL

## 2021-05-24 LAB — FERRITIN: Ferritin: 64 ng/mL (ref 24–336)

## 2021-05-24 MED ORDER — MIDODRINE HCL 5 MG PO TABS: 5.0000 mg | ORAL_TABLET | Freq: Three times a day (TID) | ORAL | 1 refills | Status: AC

## 2021-05-24 MED ORDER — QUETIAPINE FUMARATE 50 MG PO TABS: 250.0000 mg | ORAL_TABLET | Freq: Every day | ORAL | Status: AC

## 2021-05-24 NOTE — Discharge Summary (Signed)
Physician Discharge Summary   Patient: Manuel Cummings MRN: [id number] DOB: [birth year]  Admit date:     05/21/2021  Discharge date: 06/06/21  Discharge Physician: Ezekiel Slocumb   PCP: [facility]   Recommendations at discharge:    Check CBC on Monday or early next week. Follow up with Primary Care early next week Follow up on Anemia, Hbg in mid-7's and patient declines to stay in hospital for monitoring or evaluation if bleeding Follow up with nephrology Follow up on orthostatic hypotension, started on midodrine  Discharge Diagnoses Principal Problem:   AKI (acute kidney injury) ([place]) Active Problems:   CKD (chronic kidney disease), stage III (HCC)   Physical deconditioning   Falls   Orthostatic hypotension   Heme positive stool   Hyponatremia   Hyperkalemia   Adenocarcinoma of colon ([place])   Insomnia   Depression with anxiety  Resolved Problems:   Acute kidney injury [facility])   Hospital Course    76 y.o. male with past medical history of anxiety, hypertension, and chronic kidney disease stage IIIb.  Patient presents to the emergency department with complaints of frequent falls and weakness.    Admitted for Acute kidney injury with creatinine on admission 4.36, BUN 132.   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  Followed by oncology for adenocarcinoma.  --Ostomy care --Monitor  AKI (acute kidney injury) ([place]) POA with Cr 4.36, improving with IV hydration.  Baseline Cr 1.75 on 04/25/21.   No obstruction on renal U/S. 1/27: Renal function improving, creatinine 1.85 today. Avoid nephrotoxins and hypotension. Treated with IV fluids. Nephrology following, see their recs  CKD (chronic kidney disease), stage III ([place]) Baseline Cr 1.75 in December. Presented with AKI.  Falls Pt had just returned home about a week ago after  rehab stay following his hemicolectomy.  Had multiple falls at home, pt attributes to his sleep medication.  Likely related to uremia and electrolyte derangements. 1/26: Patient found to have significant orthostatic hypotension likely contributing.  Patient was noncompliant with fall precautions and attempted to get out of bed on his own resulting in a fall.  Head CT was negative and patient denies any injuries as a result. --PT/OT consults --Fall precautions --Started on midodrine to help with orthostatic symptoms  Hyperkalemia Resolved.  POA with K 7.3. K today 4.7, normal. --Monitor BMP in follow up  Hyponatremia POA with Na 128. Improved with IV fluids.  Monitor BMP in follow up.  Physical deconditioning PT / OT evaluations Fall precautions.  Depression with anxiety Patient is followed outpatient by psychiatry. Continue home medications. Outpatient follow-up as scheduled.   Heme positive stool Patient is on oral iron supplement which can cause false positive.  Hemoglobin stable and actually improved from yesterday to today.  Continue to monitor.  Stop PPI.  Very low suspicion for active GI bleeding.  Insomnia Continue trazodone, Seroquel, melatonin, triazolam  Orthostatic hypotension Patient's been having recurrent falls, likely related to orthostatic blood pressure.  On chart review appears his BP has been soft recently and they were scaling back antihypertensives. 1/26: Positive orthostatic vital signs with drop in BP from 132/57 down to 82/54. -- Hold antihypertensives -- Given IV fluids for AKI -- Continue midodrine 5 mg 3 times daily WC -- Recheck orthostatic vitals in follow up        Consultants: Nephrology Procedures performed: None  Disposition: Home Diet recommendation: Regular diet  DISCHARGE MEDICATION: Allergies as of 05/24/2021       Reactions   Codeine Other (See Comments)   Penicillins Other (See Comments)   Unknown childhood reaction         Medication List     STOP taking these medications    amitriptyline 100 MG tablet Commonly known as: ELAVIL   amLODipine 2.5 MG tablet Commonly known as: NORVASC   aspirin 81 MG tablet   busPIRone 5 MG tablet Commonly known as: BUSPAR   doxycycline 100 MG tablet Commonly known as: VIBRA-TABS   hydrOXYzine 25 MG tablet Commonly known as: ATARAX   lisinopril 5 MG tablet Commonly known as: ZESTRIL   rosuvastatin 20 MG tablet Commonly known as: CRESTOR       TAKE these medications    acetaminophen 325 MG tablet Commonly known as: TYLENOL Take 1-2 tablets by mouth every 6 (six) hours as needed.   ascorbic acid 500 MG tablet Commonly known as: VITAMIN C Take 500 mg by mouth daily.   Cholecalciferol 125 MCG (5000 UT) Tabs Take by mouth.   ferrous sulfate 325 (65 FE) MG tablet Take 325 mg by mouth in the morning.   LORazepam 1 MG tablet Commonly known as: ATIVAN Take 1 mg by mouth. Take 1mg  (1 tablet) by mouth in the morning, 0.5mg  (1/2 tablet) at noon, 0.5mg  (1/2 tablet) with dinner, and 1mg  (1 tablet) at bedtime   melatonin 3 MG Tabs tablet Take 3 mg by mouth at bedtime.   midodrine 5 MG tablet Commonly known as: PROAMATINE Take 1 tablet (5 mg total) by mouth 3 (three) times daily with meals.   polyethylene glycol powder 17 GM/SCOOP powder Commonly known as: GLYCOLAX/MIRALAX Take 17 g by mouth daily as needed.   QUEtiapine 50 MG tablet Commonly known as: SEROQUEL Take 5 tablets (250 mg total) by mouth at bedtime. What changed: when to take this   ramelteon 8 MG tablet Commonly known as: ROZEREM Take 8 mg by mouth in the morning and at bedtime.   tamsulosin 0.4 MG Caps capsule Commonly known as: FLOMAX Take by mouth.   traZODone 100 MG tablet Commonly known as: DESYREL Take 100 mg by mouth at bedtime.   zinc sulfate 220 (50 Zn) MG capsule Take 220 mg by mouth daily.        Follow-up Information     [place].  Schedule an appointment as soon as possible for a visit in 3 day(s).   Why: Patient needs very close follow up for anemia, with CBC checked on Monday if possible. Contact information: [place] [address] [phone]         [place] Follow up in 3 day(s).   Why: Patient has an appointment on Monday Jan. 30th at 3:00pm with CBC Contact information: [place] [address] [phone]                 Discharge Exam: Danley Danker Weights   05/21/21 1024  Weight: 72.6 kg   General exam: awake, alert, no acute distress HEENT: atraumatic, clear conjunctiva, anicteric sclera, moist mucus membranes, hearing grossly normal  Respiratory system: CTAB, no wheezes, rales or rhonchi, normal respiratory effort. Cardiovascular system: normal S1/S2,  RRR, no pedal edema.   Gastrointestinal system: soft, NT, ostsomy intact. Central nervous system: A&O x3. no gross focal neurologic deficits, normal speech Extremities: moves all , no edema, normal tone Skin:  dry, intact, normal temperature, normal color, No rashes, lesions or ulcers Psychiatry: normal mood, congruent affect, judgement and insight appear normal   Condition at discharge: stable  The results of significant diagnostics from this hospitalization (including imaging, microbiology, ancillary and laboratory) are listed below for reference.   Imaging Studies: CT HEAD WO CONTRAST (5MM)  Result Date: 05/23/2021 CLINICAL DATA:  Head trauma with frequent falls. EXAM: CT HEAD WITHOUT CONTRAST TECHNIQUE: Contiguous axial images were obtained from the base of the skull through the vertex without intravenous contrast. RADIATION DOSE REDUCTION: This exam was performed according to the departmental dose-optimization program which includes automated exposure control, adjustment of the mA and/or kV according to patient size and/or use of iterative reconstruction technique. COMPARISON:  The recent head  CT 05/21/2021. FINDINGS: Brain: There is mild cerebral atrophy, small vessel disease and atrophic ventriculomegaly, without midline shift. Prominent perivascular spaces are again noted in the basal ganglia posteriorly. Cerebellum and brainstem are unremarkable. No focal asymmetry is seen worrisome for acute infarct, hemorrhage or mass. The basal cisterns are clear. Vascular: There are calcifications of the carotid siphons but no hyperdense central vessels. Trace calcification distal left vertebral artery. Skull: The calvarium, skull base and orbits are intact, with no visible bone lesion. There is mild swelling in the left parietal scalp, cutaneous calcifications again noted in the frontal scalp. Sinuses/Orbits: Slight chronic membrane disease is noted posteriorly in the left maxillary sinus, scattered bilateral ethmoid air cells. Other visible sinuses and bilateral mastoid air cells are clear. Other: None. IMPRESSION: No acute intracranial CT findings or depressed skull fractures. Mild swelling in the left parietal scalp is the only appreciable interval change. Sinus disease and additional chronic changes described above. Electronically Signed   By: Telford Nab M.D.   On: 05/23/2021 05:27   CT HEAD WO CONTRAST (5MM)  Result Date: 05/21/2021 CLINICAL DATA:  Multiple falls.  Weakness.  Loss of balance. EXAM: CT HEAD WITHOUT CONTRAST CT CERVICAL SPINE WITHOUT CONTRAST TECHNIQUE: Multidetector CT imaging of the head and cervical spine was performed following the standard protocol without intravenous contrast. Multiplanar CT image reconstructions of the cervical spine were also generated. RADIATION DOSE REDUCTION: This exam was performed according to the departmental dose-optimization program which includes automated exposure control, adjustment of the mA and/or kV according to patient size and/or use of iterative reconstruction technique. COMPARISON:  None. FINDINGS: CT HEAD FINDINGS Brain: Dilated perivascular  spaces below the lentiform nuclei. The brainstem, cerebellum, cerebral peduncles, thalami, basal ganglia, basilar cisterns, and ventricular system appear within normal limits. No intracranial hemorrhage, mass lesion, or acute CVA. Vascular: Unremarkable Skull: Unremarkable Sinuses/Orbits: Mild chronic right maxillary and ethmoid sinusitis. Other: No supplemental non-categorized findings. CT CERVICAL SPINE FINDINGS Alignment: 1.5 mm degenerative posterior subluxation at C5-6. Skull base and vertebrae: No fracture or acute bony findings. Soft tissues and spinal canal: Incidental gas in the left internal jugular system, possibly from IV introduction, typically this is inconsequential in the absence of a right to left cardiac shown. Bilateral common carotid atherosclerotic calcification. Disc levels: C2-3: Mild left foraminal stenosis due to facet and uncinate spurring. C3-4: Mild left foraminal stenosis and mild central narrowing of the thecal sac due to facet and uncinate spurring, disc bulge, and a small central disc protrusion. C4-5: Mild left borderline right foraminal stenosis due to facet and uncinate spurring along with a mild disc bulge. C5-6: Moderate to prominent right foraminal stenosis due to facet and uncinate spurring. C6-7: Unremarkable. C7-T1: Unremarkable. Upper chest: Biapical pleuroparenchymal scarring.  Other: No supplemental non-categorized findings. IMPRESSION: 1. No acute intracranial findings or acute cervical spine findings. 2. 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 3. Cervical spondylosis and degenerative disc disease leading to moderate to prominent impingement at C5-6 and mild impingement at C2-3, C3-4, and C4-5. 4. Bilateral common carotid atherosclerotic calcification. Electronically Signed   By: Van Clines M.D.   On: 05/21/2021 11:24   CT Cervical Spine Wo Contrast  Result Date: 05/21/2021 CLINICAL DATA:  Multiple falls.  Weakness.  Loss of balance. EXAM: CT  HEAD WITHOUT CONTRAST CT CERVICAL SPINE WITHOUT CONTRAST TECHNIQUE: Multidetector CT imaging of the head and cervical spine was performed following the standard protocol without intravenous contrast. Multiplanar CT image reconstructions of the cervical spine were also generated. RADIATION DOSE REDUCTION: This exam was performed according to the departmental dose-optimization program which includes automated exposure control, adjustment of the mA and/or kV according to patient size and/or use of iterative reconstruction technique. COMPARISON:  None. FINDINGS: CT HEAD FINDINGS Brain: Dilated perivascular spaces below the lentiform nuclei. The brainstem, cerebellum, cerebral peduncles, thalami, basal ganglia, basilar cisterns, and ventricular system appear within normal limits. No intracranial hemorrhage, mass lesion, or acute CVA. Vascular: Unremarkable Skull: Unremarkable Sinuses/Orbits: Mild chronic right maxillary and ethmoid sinusitis. Other: No supplemental non-categorized findings. CT CERVICAL SPINE FINDINGS Alignment: 1.5 mm degenerative posterior subluxation at C5-6. Skull base and vertebrae: No fracture or acute bony findings. Soft tissues and spinal canal: Incidental gas in the left internal jugular system, possibly from IV introduction, typically this is inconsequential in the absence of a right to left cardiac shown. Bilateral common carotid atherosclerotic calcification. Disc levels: C2-3: Mild left foraminal stenosis due to facet and uncinate spurring. C3-4: Mild left foraminal stenosis and mild central narrowing of the thecal sac due to facet and uncinate spurring, disc bulge, and a small central disc protrusion. C4-5: Mild left borderline right foraminal stenosis due to facet and uncinate spurring along with a mild disc bulge. C5-6: Moderate to prominent right foraminal stenosis due to facet and uncinate spurring. C6-7: Unremarkable. C7-T1: Unremarkable. Upper chest: Biapical pleuroparenchymal  scarring. Other: No supplemental non-categorized findings. IMPRESSION: 1. No acute intracranial findings or acute cervical spine findings. 2. 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 3. Cervical spondylosis and degenerative disc disease leading to moderate to prominent impingement at C5-6 and mild impingement at C2-3, C3-4, and C4-5. 4. Bilateral common carotid atherosclerotic calcification. Electronically Signed   By: Van Clines M.D.   On: 05/21/2021 11:24   US RENAL  Result Date: 05/21/2021 CLINICAL DATA:  Acute kidney injury EXAM: RENAL / URINARY TRACT ULTRASOUND COMPLETE COMPARISON:  None. FINDINGS: Right Kidney: Renal measurements: 8.7 x 3.8 x 3.4 cm = volume: 59 mL. Echogenicity within normal limits. No mass or hydronephrosis visualized. Left Kidney: Renal measurements: 9.8 x 4.8 x 4.3 cm = volume: 106 mL. Echogenicity within normal limits. No mass or hydronephrosis visualized. Bladder: Appears normal for degree of bladder distention. Other: None. IMPRESSION: Small right kidney.  No obstruction or mass lesion. Electronically Signed   By: Franchot Gallo M.D.   On: 05/21/2021 17:13   DG Chest Port 1 View  Result Date: 05/21/2021 CLINICAL DATA:  Questionable sepsis, multiple falls and increased weakness over past few days, hypertension EXAM: PORTABLE CHEST 1 VIEW COMPARISON:  Portable exam 1053 hours compared to 01/15/2021 FINDINGS: Normal heart size, mediastinal contours, and pulmonary vascularity. Lungs clear. No pulmonary infiltrate, pleural effusion, or pneumothorax. No fractures. IMPRESSION: No acute abnormalities. Electronically  Signed   By: Lavonia Dana M.D.   On: 05/21/2021 11:55    Microbiology: Results for orders placed or performed during the hospital encounter of 05/21/21  Resp Panel by RT-PCR (Flu A&B, Covid) Nasopharyngeal Swab     Status: None   Collection Time: 05/21/21 12:48 PM   Specimen: Nasopharyngeal Swab; Nasopharyngeal(NP) swabs in vial transport medium   Result Value Ref Range Status   SARS Coronavirus 2 by RT PCR NEGATIVE NEGATIVE Final    Comment: (NOTE) SARS-CoV-2 target nucleic acids are NOT DETECTED.  The SARS-CoV-2 RNA is generally detectable in upper respiratory specimens during the acute phase of infection. The lowest concentration of SARS-CoV-2 viral copies this assay can detect is 138 copies/mL. A negative result does not preclude SARS-Cov-2 infection and should not be used as the sole basis for treatment or other patient management decisions. A negative result may occur with  improper specimen collection/handling, submission of specimen other than nasopharyngeal swab, presence of viral mutation(s) within the areas targeted by this assay, and inadequate number of viral copies(<138 copies/mL). A negative result must be combined with clinical observations, patient history, and epidemiological information. The expected result is Negative.  Fact Sheet for Patients:  EntrepreneurPulse.com.au  Fact Sheet for Healthcare Providers:  IncredibleEmployment.be  This test is no t yet approved or cleared by the [place] FDA and  has been authorized for detection and/or diagnosis of SARS-CoV-2 by FDA under an Emergency Use Authorization (EUA). This EUA will remain  in effect (meaning this test can be used) for the duration of the COVID-19 declaration under Section 564(b)(1) of the Act, 21 U.S.C.section 360bbb-3(b)(1), unless the authorization is terminated  or revoked sooner.       Influenza A by PCR NEGATIVE NEGATIVE Final   Influenza B by PCR NEGATIVE NEGATIVE Final    Comment: (NOTE) The Xpert Xpress SARS-CoV-2/FLU/RSV plus assay is intended as an aid in the diagnosis of influenza from Nasopharyngeal swab specimens and should not be used as a sole basis for treatment. Nasal washings and aspirates are unacceptable for Xpert Xpress SARS-CoV-2/FLU/RSV testing.  Fact Sheet for  Patients: EntrepreneurPulse.com.au  Fact Sheet for Healthcare Providers: IncredibleEmployment.be  This test is not yet approved or cleared by the [place] FDA and has been authorized for detection and/or diagnosis of SARS-CoV-2 by FDA under an Emergency Use Authorization (EUA). This EUA will remain in effect (meaning this test can be used) for the duration of the COVID-19 declaration under Section 564(b)(1) of the Act, 21 U.S.C. section 360bbb-3(b)(1), unless the authorization is terminated or revoked.  Performed at [facility], [address] [place], [place] [address]     Labs: CBC: No results for input(s): WBC, NEUTROABS, HGB, HCT, MCV, PLT in the last 168 hours.  Basic Metabolic Panel: No results for input(s): NA, K, CL, CO2, GLUCOSE, BUN, CREATININE, CALCIUM, MG, PHOS in the last 168 hours.  Liver Function Tests: No results for input(s): AST, ALT, ALKPHOS, BILITOT, PROT, ALBUMIN in the last 168 hours.  CBG: No results for input(s): GLUCAP in the last 168 hours.  Discharge time spent: greater than 30 minutes.  Signed: Ezekiel Slocumb, DO [facility] 06/06/2021

## 2021-05-24 NOTE — Progress Notes (Signed)
Mobility Specialist - Progress Note   05/24/21 1237  Orthostatic Lying   BP- Lying 148/59  Orthostatic Sitting  BP- Sitting 110/84  Orthostatic Standing at 0 minutes  BP- Standing at 0 minutes 129/53  Mobility  Activity Ambulated independently in hallway  Level of Assistance Modified independent, requires aide device or extra time  Assistive Device Front wheel walker  Distance Ambulated (ft) 2000 ft  Activity Response Tolerated well  $Mobility charge 1 Mobility    Orthostatics above. No dizziness. Ambulated > 2000' in hallway with no physical assist. Steady with RW, improved safety awareness. Pt left EOB with RN at bedside.    Kathee Delton Mobility Specialist 05/24/21, 12:40 PM

## 2021-05-24 NOTE — TOC Progression Note (Addendum)
Transition of Care [facility]) - Progression Note    Patient Details  Name: YAFET CLINE MRN: [id number] Date of Birth: 23-Jun-1945  [facility] CM/SW [place], [place] Phone Number: 05/24/2021, 9:05 AM  Clinical Narrative:     CSW notes patient is active with [facility] home health will resume at time of discharge. Has RN and PT, pending HH orders.   Per PT patient does need a walker, CSW has ordered walker to be delivered to room via [employer].   TOC continuing to follow for needs.    Expected Discharge Plan: [place] Barriers to Discharge: Continued Medical Work up  Expected Discharge Plan and Services Expected Discharge Plan: [place] Choice: [place] arrangements for the past 2 months: Single Family Home                           HH Arranged: PT [place]: [place] Date [place]: 05/22/21 Time [place]: [place] Representative spoke with at [place]: [place]) Interventions    Readmission Risk Interventions Readmission Risk Prevention Plan 05/22/2021 05/22/2021  Transportation Screening Complete Complete  Medication Review Press photographer) Complete -  PCP or Specialist appointment within 3-5 days of discharge Complete -  [place] or Home Care Consult Complete -  Palliative Care Screening Not Applicable -  [place] Not Applicable -  Some recent data might be hidden

## 2021-05-24 NOTE — Progress Notes (Addendum)
[facility]  ROUNDING NOTE   Subjective:   Manuel Cummings is a 76 y.o. male with past medical history of anxiety, hypertension, and chronic kidney disease stage IIIb.  Patient presents to the emergency department with complaints of frequent falls and weakness.  Patient has been admitted for Hyperkalemia [E87.5] AKI (acute kidney injury) ([place]) [N17.9] Acute kidney injury ([place]) [N17.9]   Patient is known to our practice and is followed outpatient by Dr. Holley Raring.  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    Patient seen sitting up in bed, alert  tolerating meals without nausea or vomiting Denies shortness of breath Patient requesting discharge  Creatinine 1.9 Urine output of 875 mL recorded in 24 hours  Objective:  Vital signs in last 24 hours:  Temp:  [97.8 F (36.6 C)-99.2 F (37.3 C)] 98.2 F (36.8 C) (01/27 1122) Pulse Rate:  [79-91] 85 (01/27 1122) Resp:  [16-20] 17 (01/27 1122) BP: (106-178)/(52-70) 154/60 (01/27 1122) SpO2:  [97 %-100 %] 97 % (01/27 1122)  Weight change:  Filed Weights   05/21/21 1024  Weight: 72.6 kg    Intake/Output: I/O last 3 completed shifts: In: 2161.6 [P.O.:240; I.V.:1921.6] Out: 1850 [Urine:1050; Stool:800]   Intake/Output this shift:  Total I/O In: 120 [P.O.:120] Out: -   Physical Exam: General: NAD, resting in bed  Head: Normocephalic, atraumatic.  Moist oral mucosal membranes  Eyes: Anicteric  Lungs:  Clear to auscultation, normal effort  Heart: Regular rate and rhythm  Abdomen:  Soft, nontender, nondistended  Extremities: No peripheral edema.  Neurologic: Alert oriented to self   Skin: Abrasions on nose, poor skin turgor  Dialysis Access: None    Basic Metabolic Panel: Recent Labs  Lab 05/21/21 1021 05/21/21 1838 05/22/21 0459 05/23/21 0542 05/24/21 0445  NA 128* 130* 133* 135 136  K  7.3* 5.5* 4.7 4.7 4.7  CL 98 98 101 102 104  CO2 17* 20* 24 27 26   GLUCOSE 139* 123* 109* 111* 107*  BUN 132* 115* 94* 76* 54*  CREATININE 4.36* 3.84* 3.07* 2.34* 1.85*  CALCIUM 8.6* 8.0* 7.2* 7.5* 7.8*  MG  --   --   --  2.5*  --   PHOS  --   --   --  4.3  --      Liver Function Tests: Recent Labs  Lab 05/21/21 1021  AST 21  ALT 14  ALKPHOS 64  BILITOT 0.5  PROT 7.0  ALBUMIN 3.6    No results for input(s): LIPASE, AMYLASE in the last 168 hours. No results for input(s): AMMONIA in the last 168 hours.  CBC: Recent Labs  Lab 05/21/21 1021 05/22/21 0459 05/23/21 0542 05/24/21 0445  WBC 7.1 3.5* 4.7 4.3  NEUTROABS 5.9  --   --   --   HGB 9.9* 7.7* 8.4* 7.3*  HCT 30.5* 23.1* 25.7* 22.8*  MCV 94.1 92.8 93.5 96.2  PLT 516* 313 310 256     Cardiac Enzymes: Recent Labs  Lab 05/21/21 1838  CKTOTAL 506*     BNP: Invalid input(s): POCBNP  CBG: No results for input(s): GLUCAP in the last 168 hours.  Microbiology: Results for orders placed or performed during the hospital encounter of 05/21/21  Resp Panel by RT-PCR (Flu A&B, Covid) Nasopharyngeal Swab     Status: None   Collection Time: 05/21/21 12:48 PM   Specimen: Nasopharyngeal Swab; Nasopharyngeal(NP)  swabs in vial transport medium  Result Value Ref Range Status   SARS Coronavirus 2 by RT PCR NEGATIVE NEGATIVE Final    Comment: (NOTE) SARS-CoV-2 target nucleic acids are NOT DETECTED.  The SARS-CoV-2 RNA is generally detectable in upper respiratory specimens during the acute phase of infection. The lowest concentration of SARS-CoV-2 viral copies this assay can detect is 138 copies/mL. A negative result does not preclude SARS-Cov-2 infection and should not be used as the sole basis for treatment or other patient management decisions. A negative result may occur with  improper specimen collection/handling, submission of specimen other than nasopharyngeal swab, presence of viral mutation(s) within  the areas targeted by this assay, and inadequate number of viral copies(<138 copies/mL). A negative result must be combined with clinical observations, patient history, and epidemiological information. The expected result is Negative.  Fact Sheet for Patients:  EntrepreneurPulse.com.au  Fact Sheet for Healthcare Providers:  IncredibleEmployment.be  This test is no t yet approved or cleared by the [place] FDA and  has been authorized for detection and/or diagnosis of SARS-CoV-2 by FDA under an Emergency Use Authorization (EUA). This EUA will remain  in effect (meaning this test can be used) for the duration of the COVID-19 declaration under Section 564(b)(1) of the Act, 21 U.S.C.section 360bbb-3(b)(1), unless the authorization is terminated  or revoked sooner.       Influenza A by PCR NEGATIVE NEGATIVE Final   Influenza B by PCR NEGATIVE NEGATIVE Final    Comment: (NOTE) The Xpert Xpress SARS-CoV-2/FLU/RSV plus assay is intended as an aid in the diagnosis of influenza from Nasopharyngeal swab specimens and should not be used as a sole basis for treatment. Nasal washings and aspirates are unacceptable for Xpert Xpress SARS-CoV-2/FLU/RSV testing.  Fact Sheet for Patients: EntrepreneurPulse.com.au  Fact Sheet for Healthcare Providers: IncredibleEmployment.be  This test is not yet approved or cleared by the [place] FDA and has been authorized for detection and/or diagnosis of SARS-CoV-2 by FDA under an Emergency Use Authorization (EUA). This EUA will remain in effect (meaning this test can be used) for the duration of the COVID-19 declaration under Section 564(b)(1) of the Act, 21 U.S.C. section 360bbb-3(b)(1), unless the authorization is terminated or revoked.  Performed at [facility], [address] [place], [place] [address]     Coagulation Studies: No results for input(s):  LABPROT, INR in the last 72 hours.   Urinalysis: No results for input(s): COLORURINE, LABSPEC, PHURINE, GLUCOSEU, HGBUR, BILIRUBINUR, KETONESUR, PROTEINUR, UROBILINOGEN, NITRITE, LEUKOCYTESUR in the last 72 hours.  Invalid input(s): APPERANCEUR     Imaging: CT HEAD WO CONTRAST (5MM)  Result Date: 05/23/2021 CLINICAL DATA:  Head trauma with frequent falls. EXAM: CT HEAD WITHOUT CONTRAST TECHNIQUE: Contiguous axial images were obtained from the base of the skull through the vertex without intravenous contrast. RADIATION DOSE REDUCTION: This exam was performed according to the departmental dose-optimization program which includes automated exposure control, adjustment of the mA and/or kV according to patient size and/or use of iterative reconstruction technique. COMPARISON:  The recent head CT 05/21/2021. FINDINGS: Brain: There is mild cerebral atrophy, small vessel disease and atrophic ventriculomegaly, without midline shift. Prominent perivascular spaces are again noted in the basal ganglia posteriorly. Cerebellum and brainstem are unremarkable. No focal asymmetry is seen worrisome for acute infarct, hemorrhage or mass. The basal cisterns are clear. Vascular: There are calcifications of the carotid siphons but no hyperdense central vessels. Trace calcification distal left vertebral artery. Skull: The calvarium, skull base and orbits  are intact, with no visible bone lesion. There is mild swelling in the left parietal scalp, cutaneous calcifications again noted in the frontal scalp. Sinuses/Orbits: Slight chronic membrane disease is noted posteriorly in the left maxillary sinus, scattered bilateral ethmoid air cells. Other visible sinuses and bilateral mastoid air cells are clear. Other: None. IMPRESSION: No acute intracranial CT findings or depressed skull fractures. Mild swelling in the left parietal scalp is the only appreciable interval change. Sinus disease and additional chronic changes described  above. Electronically Signed   By: Telford Nab M.D.   On: 05/23/2021 05:27     Medications:    lactated ringers Stopped (05/24/21 1223)    ascorbic acid  500 mg Oral Daily   cholecalciferol  5,000 Units Oral Daily   melatonin  2.5 mg Oral QHS   midodrine  5 mg Oral TID WC   QUEtiapine  200 mg Oral QHS   tamsulosin  0.4 mg Oral Daily   traZODone  100 mg Oral QHS   triazolam  0.25 mg Oral QHS   fentaNYL (SUBLIMAZE) injection, LORazepam, ondansetron **OR** ondansetron (ZOFRAN) IV, traMADol  Assessment/ Plan:  Mr. Manuel Cummings is a 76 y.o.  male ith past medical history of anxiety, hypertension, and chronic kidney disease stage IIIb.  Patient presents to the emergency department with complaints of frequent falls and weakness.  Patient has been admitted for Hyperkalemia [E87.5] AKI (acute kidney injury) ([place]) [N17.9] Acute kidney injury ([place]) [N17.9]   Acute Kidney Injury on chronic kidney disease stage IIIb with baseline creatinine 1.75 and GFR of 40 on 04/05/21.  Acute kidney injury secondary to dehydration, patient appears hypovolemic. Renal ultrasound -no obstruction  lisinopril held Avoid nephrotoxic agents and therapies.  Creatinine continues to improve  Urine output of 875 mils recorded in 24 hours  Patient cleared to discharge from renal stance with outpatient follow up with [facility]  Lab Results  Component Value Date   CREATININE 1.85 (H) 05/24/2021   CREATININE 2.34 (H) 05/23/2021   CREATININE 3.07 (H) 05/22/2021    Intake/Output Summary (Last 24 hours) at 05/24/2021 1425 Last data filed at 05/24/2021 1100 Gross per 24 hour  Intake 791.54 ml  Output 650 ml  Net 141.54 ml    2.  Severe hyperkalemia Corrected with IV bicarb infusion and Lokelma.   3.  Hypertension with chronic kidney disease.  Home regimen includes lisinopril, amlodipine, and amitriptyline.  All currently held at this time.  BP Readings from Last 1 Encounters:  05/24/21 (!) 154/60    Currently  receiving midodrine 3 times daily.  May consider holding due to increased BP   LOS: 3   1/27/20232:25 PM

## 2021-05-24 NOTE — Care Management Important Message (Signed)
Important Message  Patient Details  Name: Manuel Cummings MRN: [id number] Date of Birth: 1946/03/29   Medicare Important Message Given:  Yes     [name] 05/24/2021, 1:00 PM

## 2021-06-06 ENCOUNTER — Telehealth: Payer: Self-pay | Admitting: Primary Care

## 2021-06-06 ENCOUNTER — Other Ambulatory Visit: Payer: Self-pay

## 2021-06-06 ENCOUNTER — Other Ambulatory Visit: Payer: Medicare Other | Admitting: Primary Care

## 2021-06-06 DIAGNOSIS — F418 Other specified anxiety disorders: Secondary | ICD-10-CM

## 2021-06-06 DIAGNOSIS — R5381 Other malaise: Secondary | ICD-10-CM

## 2021-06-06 DIAGNOSIS — C189 Malignant neoplasm of colon, unspecified: Secondary | ICD-10-CM

## 2021-06-06 DIAGNOSIS — G4700 Insomnia, unspecified: Secondary | ICD-10-CM

## 2021-06-06 NOTE — Progress Notes (Signed)
[facility] Consult Note Telephone: [phone]  Fax: [phone]   Date of encounter: 06/06/21 1:57 PM PATIENT NAME: Manuel Cummings Manuel Cummings [id number]   [phone] (home)  DOB: Oct 25, 1945 MRN: [id number] PRIMARY CARE PROVIDER:    [facility],  [place] [address] [phone]  REFERRING PROVIDER:   [facility] [address] [place],  [place] [address] [phone]  RESPONSIBLE PARTY:    Contact Information     Name Relation Home Work Mobile   haris,kirk Son   [phone]   paolo, okane   [phone]      I met face to face with patient in his home. Palliative Care was asked to follow this patient by consultation request of  [place] to address advance care planning and complex medical decision making. This is the initial visit.                                    ASSESSMENT AND PLAN / RECOMMENDATIONS:   Advance Care Planning/Goals of Care: Goals include to maximize quality of life and symptom management. Patient/health care surrogate gave his/her permission to discuss.Our advance care planning conversation included a discussion about:    The value and importance of advance care planning  Exploration of personal, cultural or spiritual beliefs that might influence medical decisions Identification of a healthcare agent - sons Review of an  advance directive document - left MOST and [place] Wishes  CODE STATUS: TBD, full at this time.  Symptom Management/Plan:   Establishing Care: Met today to introduce palliative care services, define palliative care, understand his personal priorities and needs, and establish rapport. Provided verbal and written information on palliative care, patient wishes, and [place] directives. His sons share power of attorney but do not live locally.  Mood: Home health RN endorses poor sleep and  insomnia. Patient appeared distracted and other times singularly focused on his ostomy supplies.  Needs supplies: Mr. Rowen wanted to ensure that he has adequate supplies. We discussed different avenues to create an account with the supply companies. His supplies were reviewed and recorded so that they could be re-ordered. Coordinating with home nurses to set this up.  Care coordination: Appears to need support services in home. Currently focused on his ostomy supplies and finishing home health course. I have passed message along for supplies he needs for ostomy, and will order for him to local or mail order supply company as he desires. I have also notified home health that they have missed 2 of their scheduled visits with him this week and last week, which is causing him anxiety.  Follow up Palliative Care Visit: Palliative care will continue to follow for complex medical decision making, advance care planning, and clarification of goals. Return 6weeks or prn.  I spent 60 minutes providing this consultation. More than 50% of the time in this consultation was spent in counseling and care coordination.  PPS: 50%  HOSPICE ELIGIBILITY/DIAGNOSIS: TBD  Chief Complaint: New consultation, need for ostomy supplies, and care coordination.  HISTORY OF PRESENT ILLNESS:  Manuel Cummings is a 76 y.o. year old male  with colon cancer, anxiety and depression, restless leg syndrome, iron deficiency anemia, chronic kidney disease. He was seen today as a new consultation to discuss palliative care services, establish rapport, assess his current  needs, and start advanced directives. He is doing well today and has minimal symptoms to report but is extremely concerned about his ostomy supplies and would like this to be the focus of the appointment. Supply information was noted so that the coordination of supply ordering could be initiated. Reviewed medications, current plan for oncologic treatments, ability to perform  ADLs and care for his ostomy and stoma.  History obtained from review of EMR, discussion with primary team, and interview with family, facility staff/caregiver and/or Mr. Manuel Cummings.  I reviewed available labs, medications, imaging, studies and related documents from the EMR.  Records reviewed and summarized above.   ROS  General: NAD EYES: denies vision changes. ENMT: denies dysphagia Cardiovascular: denies chest pain, denies DOE Pulmonary: denies cough, denies increased SOB Abdomen: endorses good appetite, ostomy bag in place. Consistent output. GU: denies dysuria, endorses continence of urine MSK:  denies increased weakness. Last fall: unreported Skin: denies rashes or wounds Neurological: denies pain. History of insomnia, currently not well managed. Psych: Flat affect. Guarded.  Heme/lymph/immuno: denies bruises, abnormal bleeding.   Physical Exam: Constitutional: NAD ENMT: intact hearing, dentition intact CV: S1S2, RRR, no LE edema Pulmonary: LCTA, no increased work of breathing, no cough, room air Abdomen: intake 100%. Ostomy bag in place. Non tender GU: deferred MSK: moves all extremities, ambulatory (I) Skin: warm and dry, no rashes or wounds on visible skin, ostomy stoma Neuro:  + generalized weakness, flat affect,  Psych: A and O x 3, anxious Hem/lymph/immuno: no widespread bruising. No blood in stool, urine, epitaxis, bleeding of gums.   CURRENT PROBLEM LIST:  Patient Active Problem List   Diagnosis Date Noted   Heme positive stool 05/23/2021   Orthostatic hypotension 05/23/2021   Insomnia 05/23/2021   Depression with anxiety 05/23/2021   AKI (acute kidney injury) ([place]) 05/22/2021   Falls 05/22/2021   Hyponatremia 05/21/2021   Hyperkalemia 05/21/2021   Physical deconditioning 05/21/2021   Adenocarcinoma of colon ([place]) 05/21/2021   CKD (chronic kidney disease), stage III ([place]) 05/21/2021   Hypertension 01/15/2021   PAST MEDICAL HISTORY:  Active Ambulatory  Problems    Diagnosis Date Noted   Hypertension 01/15/2021   Hyponatremia 05/21/2021   Hyperkalemia 05/21/2021   Physical deconditioning 05/21/2021   Adenocarcinoma of colon ([place]) 05/21/2021   CKD (chronic kidney disease), stage III ([place]) 05/21/2021   AKI (acute kidney injury) ([place]) 05/22/2021   Falls 05/22/2021   Heme positive stool 05/23/2021   Orthostatic hypotension 05/23/2021   Insomnia 05/23/2021   Depression with anxiety 05/23/2021   Resolved Ambulatory Problems    Diagnosis Date Noted   Acute kidney injury ([place]) 05/21/2021   Past Medical History:  Diagnosis Date   Anxiety    SOCIAL HX:  Social History   Tobacco Use   Smoking status: Former    Packs/day: 1.00    Years: 2.00    Pack years: 2.00    Types: Cigarettes    Quit date: 04/28/1962    Years since quitting: 59.1   Smokeless tobacco: Never  Substance Use Topics   Alcohol use: No   FAMILY HX: No family history on file.    ALLERGIES:  Allergies  Allergen Reactions   Codeine Other (See Comments)   Penicillins Other (See Comments)    Unknown childhood reaction     PERTINENT MEDICATIONS:  Outpatient Encounter Medications as of 06/06/2021  Medication Sig   acetaminophen (TYLENOL) 325 MG tablet Take 1-2 tablets by mouth every 6 (six) hours as needed.  ascorbic acid (VITAMIN C) 500 MG tablet Take 500 mg by mouth daily.   Cholecalciferol 125 MCG (5000 UT) TABS Take by mouth.   ferrous sulfate 325 (65 FE) MG tablet Take 325 mg by mouth in the morning.   melatonin 3 MG TABS tablet Take 3 mg by mouth at bedtime.   midodrine (PROAMATINE) 5 MG tablet Take 1 tablet (5 mg total) by mouth 3 (three) times daily with meals.   QUEtiapine (SEROQUEL) 50 MG tablet Take 5 tablets (250 mg total) by mouth at bedtime.   ramelteon (ROZEREM) 8 MG tablet Take 8 mg by mouth in the morning and at bedtime.   rOPINIRole (REQUIP) 1 MG tablet Take 1 mg by mouth at bedtime.   tamsulosin (FLOMAX) 0.4 MG CAPS capsule Take by mouth.    traZODone (DESYREL) 100 MG tablet Take 100 mg by mouth at bedtime.   zinc sulfate 220 (50 Zn) MG capsule Take 220 mg by mouth daily.   LORazepam (ATIVAN) 1 MG tablet Take 1 mg by mouth. Take 69m (1 tablet) by mouth in the morning, 0.524m(1/2 tablet) at noon, 0.106m6m1/2 tablet) with dinner, and 1mg24m tablet) at bedtime   polyethylene glycol powder (GLYCOLAX/MIRALAX) 17 GM/SCOOP powder Take 17 g by mouth daily as needed. (Patient not taking: Reported on 06/06/2021)   No facility-administered encounter medications on file as of 06/06/2021.     Thank you for the opportunity to participate in the care of Mr. HarrBerkowitzhe palliative care team will continue to follow. Please call our office at [phone] can be of additional assistance.   KathJason Coop , DNP, AGPCNP-BC  COVID-19 PATIENT SCREENING TOOL Asked and negative response unless otherwise noted:  Have you had symptoms of covid, tested positive or been in contact with someone with symptoms/positive test in the past 5-10 days?

## 2021-06-06 NOTE — Telephone Encounter (Signed)
Spoke with patient regarding the Palliative referral/services and all questions were answered and he was in agreement with beginning services with [place].  I have scheduled an In-home Consult for 06/06/21 @ 2:30 PM

## 2021-06-21 ENCOUNTER — Ambulatory Visit: Admit: 2021-06-21 | Payer: Medicare Other

## 2021-06-21 SURGERY — EGD (ESOPHAGOGASTRODUODENOSCOPY)
Anesthesia: General

## 2021-07-08 ENCOUNTER — Telehealth: Payer: Self-pay | Admitting: Primary Care

## 2021-07-08 NOTE — Telephone Encounter (Signed)
T/c from [place], patient will be d/c  from home health soon due to goals completed.  ?

## 2021-07-09 ENCOUNTER — Telehealth: Payer: Self-pay

## 2021-07-09 NOTE — Telephone Encounter (Signed)
1030 am.  Follow up phone call made to patient after ED visit over the weekend.  Patient answered the phone and I advised that I was completing a check-in call after ED visit.  Patient stated he was doing fine, thank you for calling and hung up the phone.  Update provided to Ralene Bathe, NP.  ?

## 2021-07-18 ENCOUNTER — Telehealth: Payer: Self-pay

## 2021-07-25 ENCOUNTER — Other Ambulatory Visit: Payer: Medicare Other | Admitting: Primary Care

## 2021-07-29 ENCOUNTER — Other Ambulatory Visit: Payer: Medicare Other | Admitting: Primary Care

## 2021-07-29 VITALS — Ht 74.0 in | Wt 152.0 lb

## 2021-07-29 DIAGNOSIS — C189 Malignant neoplasm of colon, unspecified: Secondary | ICD-10-CM

## 2021-07-29 DIAGNOSIS — Z515 Encounter for palliative care: Secondary | ICD-10-CM

## 2021-07-29 DIAGNOSIS — F418 Other specified anxiety disorders: Secondary | ICD-10-CM

## 2021-07-29 DIAGNOSIS — R5381 Other malaise: Secondary | ICD-10-CM

## 2021-07-29 NOTE — Progress Notes (Signed)
? ? ?Manufacturing engineer ?Community Palliative [facility] Consult Note ?Telephone: [phone]  ?Fax: [phone]  ? ? ?Date of encounter: 07/29/21 ?1:58 PM ?PATIENT NAME: Manuel Cummings ?[address] ?[place] [address]   ?[phone] (home)  ?DOB: 12/06/1945 ?MRN: [id number] ?PRIMARY CARE PROVIDER:    ?Manuel Rana, MD,  ?[place] [address] ?[phone] ? ?REFERRING PROVIDER:   ?Manuel Rana, MD ?[place],  [place] [address] ?[phone] ? ?RESPONSIBLE PARTY:    ?Contact Information   ? ? Name Relation Home Work Mobile  ? Manuel Cummings   [phone]  ? Manuel Cummings   [phone]  ? ?  ? ? ? ?I met face to face with patient in his  home. Palliative Care was asked to follow this patient by consultation request of  Manuel Rana, MD to address advance care planning and complex medical decision making. This is a follow up visit. ? ?                                 ASSESSMENT AND PLAN / RECOMMENDATIONS:  ? ?Advance Care Planning/Goals of Care: Goals include to maximize quality of life and symptom management. Patient/health care surrogate gave his/her permission to discuss.Our advance care planning conversation included a discussion about:   ?Exploration of personal, cultural or spiritual beliefs that might influence medical decisions  ?CODE STATUS: FULL ? ?Symptom Management/Plan: ? ?Depression:  Today we discussed his depression. Cummings had been seen in emergency several weeks back for suicidal ideation. Today Cummings states that Cummings does want to die but Cummings doesn't have any plans and is afraid to kill himself. Cummings states this in the context of his worsening health, his wife's death several years ago and his lack of interest in activities. Cummings states Cummings used to golf but now is unable to due to his ostomy. Manuel Kitchen Cummings states Cummings is compliant with his medications and is  on a psychiatric medicine regiment that is two months duration. Cummings feels Cummings is doing a little better  with that but Cummings states Cummings doesn't really see any difference. ? ?Mobility: Cummings also stated poor endurance. I suggested perhaps Cummings could do some putt-ing and Cummings said Cummings would think about it.  ? ?Anxiety: Today I find him less anxious than my first visit, although his first question upon my arrival was how long will this take.  Cummings did state Cummings wanted continued palliative services, someone to check on him monthly. She states Cummings does keep his appointments with psychiatry as well.  ?Cummings endorses high hearing acuity which is irritating to him. Cummings says Cummings doesn't like to go to restaurants or to church because of the loud noise. Cummings also doesn't like his  bedroom because the air-conditioning units are just outside of it and turned on and off. Cummings also states Cummings doesn't care to watch TV and Cummings's not interested in baseball anymore. 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 Cummings stated Cummings is thinking to  change bedrooms but seems to lack the energy  for many self care efforts. ? ?Follow up Palliative Care Visit: Palliative care will continue to follow for complex medical decision making, advance care planning, and clarification of goals. Return 4 weeks or prn. ? ?This visit was coded based on medical decision making (MDM). ? ?PPS: 50% ? ?HOSPICE ELIGIBILITY/DIAGNOSIS: TBD ? ?  Chief Complaint: anxiety ? ?HISTORY OF PRESENT ILLNESS:  Manuel Cummings is a 76 y.o. year old male  with cancer of bowel, ostomy, depression and anxiety . Patient seen today to review palliative care needs to include medical decision making and advance care planning as appropriate.  ? ?History obtained from review of EMR, discussion with primary team, and interview with family, facility staff/caregiver and/or Manuel Cummings.  ?I reviewed available labs, medications, imaging, studies and related documents from the EMR.  Records reviewed and summarized above.  ? ?ROS ? ?General: reports anxiousness ?EYES: denies vision changes, has glasses  ?ENMT: denies  dysphagia ?Pulmonary: denies cough, denies increased SOB ?Abdomen: endorses good  to fair appetite, ostomy  of bowel ?GU: denies dysuria, endorses continence of urine ?MSK:  denies  increased weakness,  no falls reported ?Skin: denies rashes or wounds ?Psych: Endorses  anxious  mood ? ?Physical Exam: ?Current and past weights:8 lb loss in 2 mos, 5%. Body mass index is 19.52 kg/m?. ? ?Constitutional:anxious ?General: frail appearing, thin ?EYES: anicteric sclera, lids intact, no discharge  ?ENMT: intact hearing, oral mucous membranes moist, dentition intact ?CV: no LE edema ?Pulmonary:no increased work of breathing, no cough, room air ?Abdomen: intake 50%, no ascites ?MSK: + sarcopenia, moves all extremities, ambulatory ?Skin: warm and dry, no rashes or wounds on visible skin ?Neuro: + generalized weakness,  +anxious affect ? ? ?Thank you for the opportunity to participate in the care of Manuel Cummings.  The palliative care team will continue to follow. Please call our office at [phone] if we can be of additional assistance.  ? ?Manuel Coop, NP DNP, AGPCNP-BC ? ?COVID-19 PATIENT SCREENING TOOL ?Asked and negative response unless otherwise noted:  ? ?Have you had symptoms of covid, tested positive or been in contact with someone with symptoms/positive test in the past 5-10 days?  ? ?

## 2021-08-26 DEATH — deceased

## 2021-09-02 ENCOUNTER — Telehealth: Payer: Self-pay | Admitting: Primary Care

## 2021-09-02 NOTE — Telephone Encounter (Signed)
Patient expired on 08/05/2021. ?

## 2021-09-03 ENCOUNTER — Other Ambulatory Visit: Payer: 59 | Admitting: Primary Care

## 2021-09-05 ENCOUNTER — Other Ambulatory Visit: Payer: 59 | Admitting: Primary Care

## 2022-10-14 IMAGING — CT CT HEAD W/O CM
4 series · 17 of 47 positions shown, 19 images · non-contrast
Comparison: The recent head CT 05/21/2021.

CLINICAL DATA: Head trauma with frequent falls.



[Series 2: head wo · axial · 0.45mm/px · z∈[-156,-36]mm · 7 of 34 slices shown, 9 images]
[im 5/34  brain]
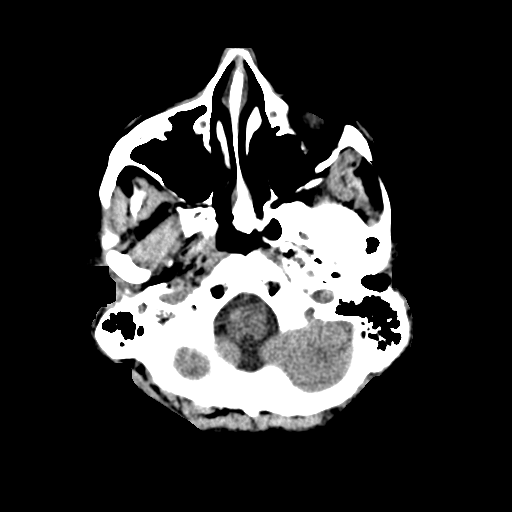
[im 5/34  bone]
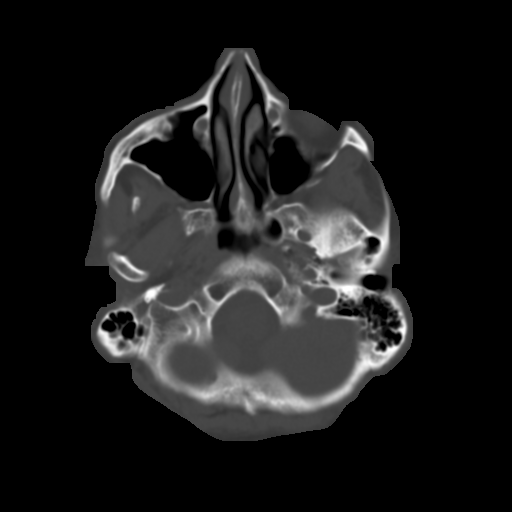
[im 9/34  brain]
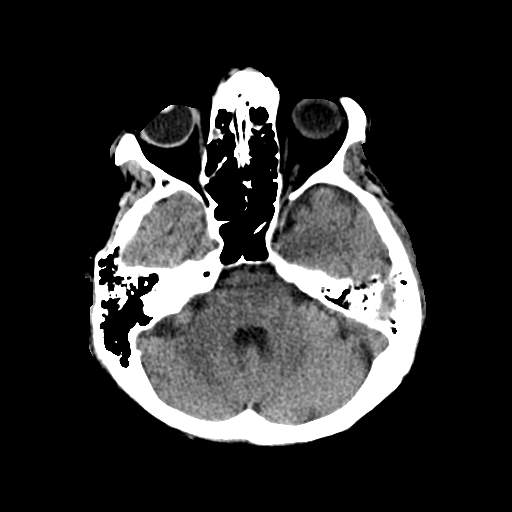
[im 13/34  brain]
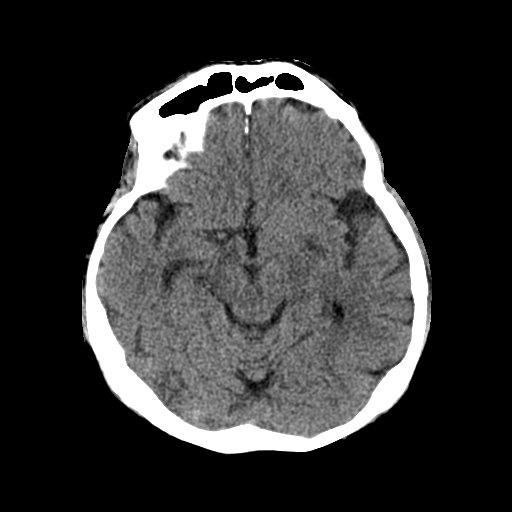
[im 17/34  brain]
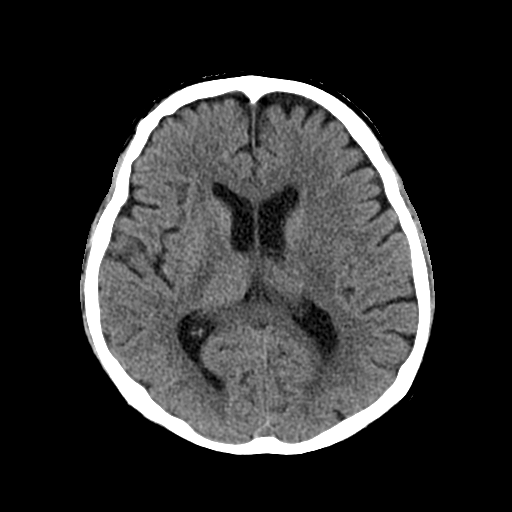
[im 21/34  brain]
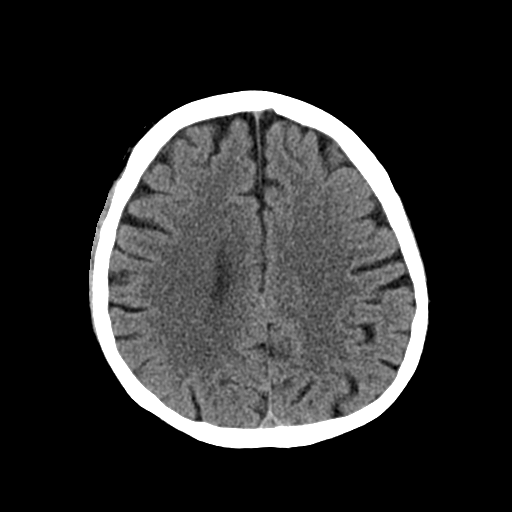
[im 21/34  bone]
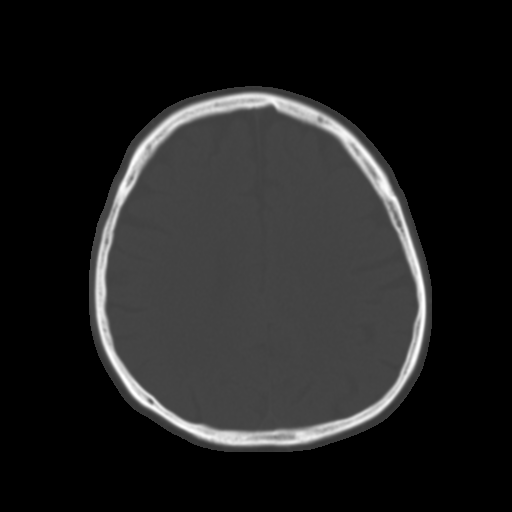
[im 25/34  brain]
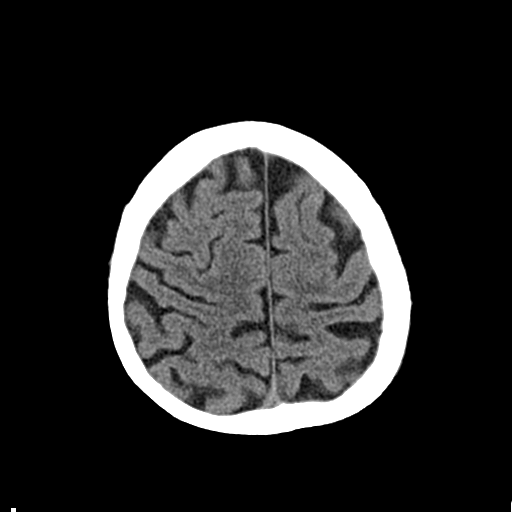
[im 29/34  brain]
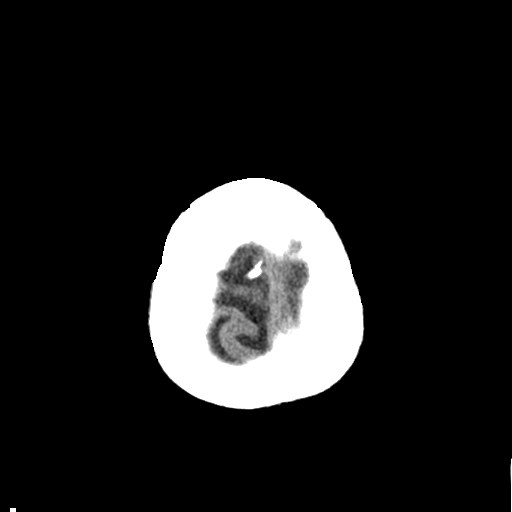

[Series 3: head bone · axial · 0.45mm/px · z∈[-160,-102]mm · 4 of 85 slices shown]
[im 9/85  bone]
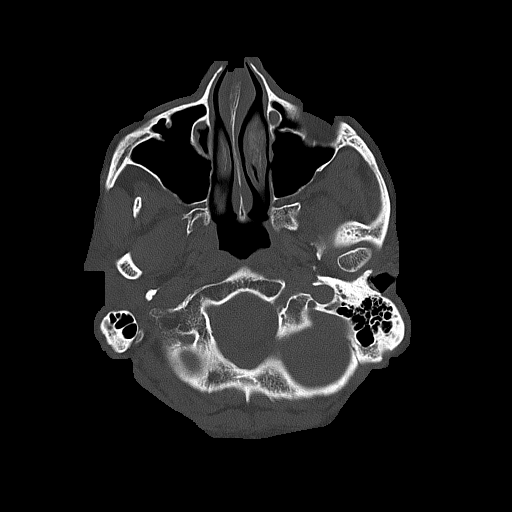
[im 17/85  bone]
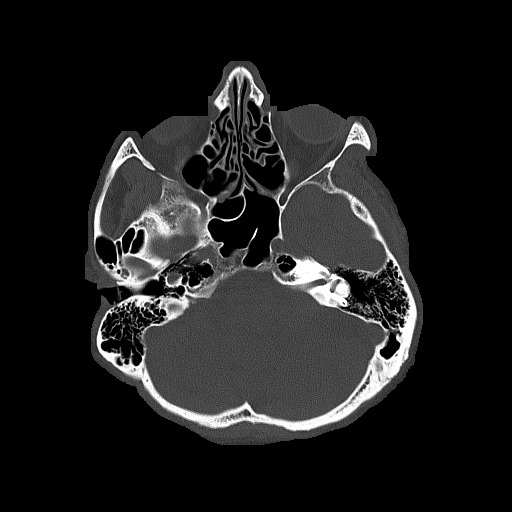
[im 26/85  bone]
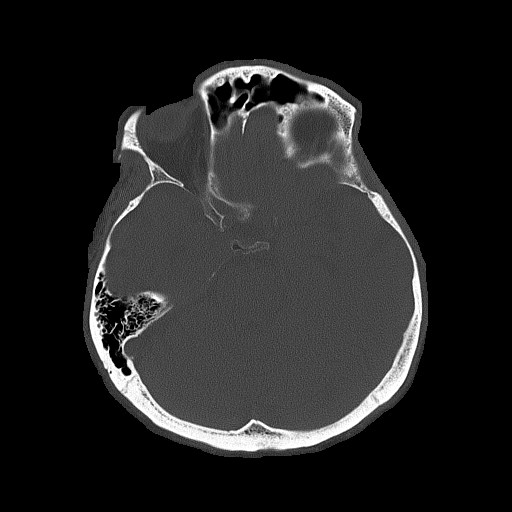
[im 38/85  bone]
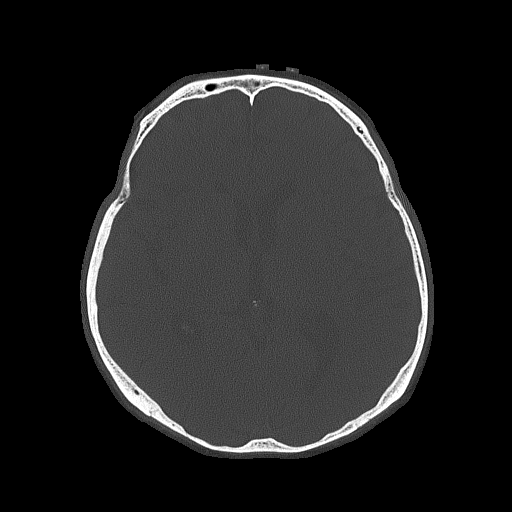

[Series 4: coronal soft tissue · coronal · 0.36mm/px · 3 of 66 slices shown]
[im 24/66  brain]
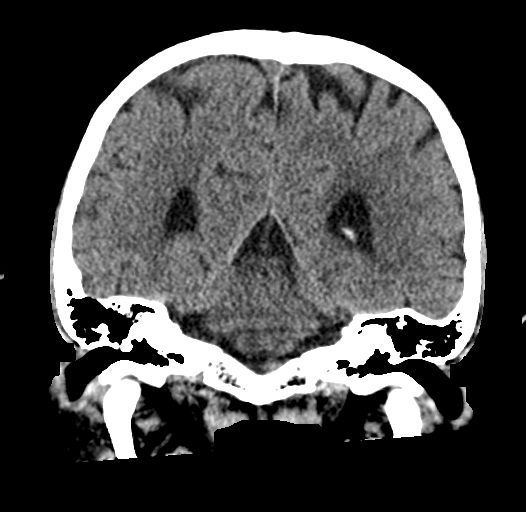
[im 30/66  brain]
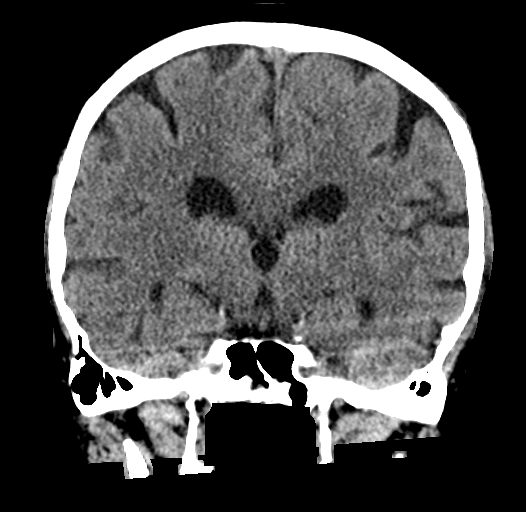
[im 36/66  brain]
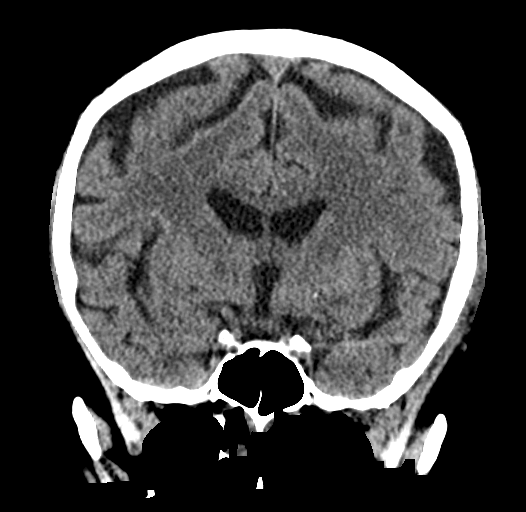

[Series 5: sagittal soft tissue · sagittal · 0.36mm/px · 3 of 60 slices shown]
[im 20/60  brain]
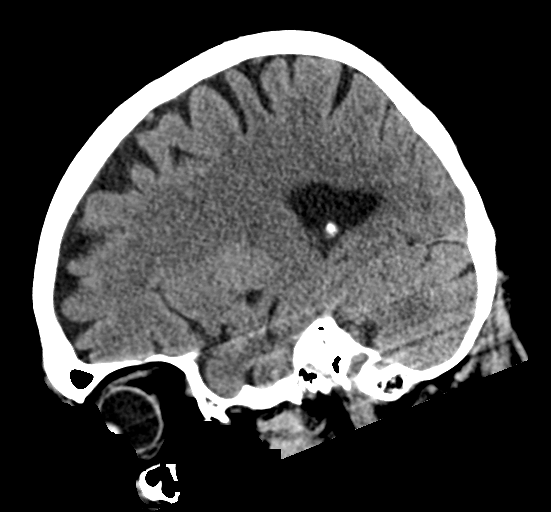
[im 30/60  brain]
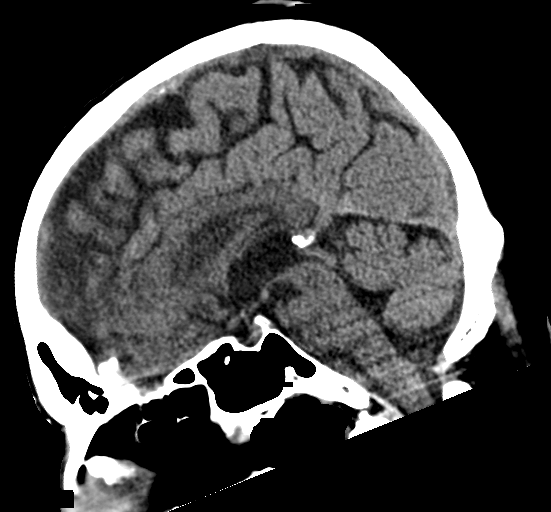
[im 40/60  brain]
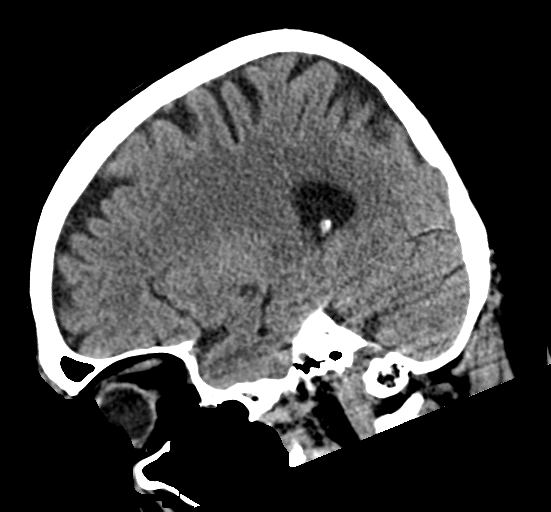

[17 of 47 positions shown; findings below may reference images not displayed]

FINDINGS: Brain: There is mild cerebral atrophy, small vessel disease and
atrophic ventriculomegaly, without midline shift. Prominent
perivascular spaces are again noted in the basal ganglia
posteriorly. Cerebellum and brainstem are unremarkable.

No focal asymmetry is seen worrisome for acute infarct, hemorrhage
or mass. The basal cisterns are clear.

Vascular: There are calcifications of the carotid siphons but no
hyperdense central vessels. Trace calcification distal left
vertebral artery.

Skull: The calvarium, skull base and orbits are intact, with no
visible bone lesion. There is mild swelling in the left parietal
scalp, cutaneous calcifications again noted in the frontal scalp.

Sinuses/Orbits: Slight chronic membrane disease is noted posteriorly
in the left maxillary sinus, scattered bilateral ethmoid air cells.
Other visible sinuses and bilateral mastoid air cells are clear.

Other: None.
IMPRESSION: No acute intracranial CT findings or depressed skull fractures. Mild
swelling in the left parietal scalp is the only appreciable interval
change. Sinus disease and additional chronic changes described
above.
